# Patient Record
Sex: Female | Born: 1940 | Race: Black or African American | Hispanic: No | Marital: Single | Smoking: Current every day smoker
Health system: Southern US, Community
[De-identification: ages and names within clinical notes are randomized; demographics above are authoritative.]

## PROBLEM LIST (undated history)

## (undated) DIAGNOSIS — J410 Simple chronic bronchitis: Secondary | ICD-10-CM

## (undated) DIAGNOSIS — E785 Hyperlipidemia, unspecified: Secondary | ICD-10-CM

## (undated) DIAGNOSIS — I10 Essential (primary) hypertension: Secondary | ICD-10-CM

## (undated) DIAGNOSIS — Z8679 Personal history of other diseases of the circulatory system: Secondary | ICD-10-CM

## (undated) DIAGNOSIS — Z9289 Personal history of other medical treatment: Secondary | ICD-10-CM

## (undated) DIAGNOSIS — D494 Neoplasm of unspecified behavior of bladder: Secondary | ICD-10-CM

## (undated) DIAGNOSIS — Z87442 Personal history of urinary calculi: Secondary | ICD-10-CM

## (undated) DIAGNOSIS — R011 Cardiac murmur, unspecified: Secondary | ICD-10-CM

## (undated) DIAGNOSIS — R0602 Shortness of breath: Secondary | ICD-10-CM

## (undated) DIAGNOSIS — M069 Rheumatoid arthritis, unspecified: Secondary | ICD-10-CM

## (undated) DIAGNOSIS — E119 Type 2 diabetes mellitus without complications: Secondary | ICD-10-CM

## (undated) DIAGNOSIS — L930 Discoid lupus erythematosus: Secondary | ICD-10-CM

## (undated) DIAGNOSIS — K219 Gastro-esophageal reflux disease without esophagitis: Secondary | ICD-10-CM

## (undated) DIAGNOSIS — E890 Postprocedural hypothyroidism: Secondary | ICD-10-CM

## (undated) DIAGNOSIS — H9190 Unspecified hearing loss, unspecified ear: Secondary | ICD-10-CM

## (undated) DIAGNOSIS — C679 Malignant neoplasm of bladder, unspecified: Secondary | ICD-10-CM

## (undated) DIAGNOSIS — D126 Benign neoplasm of colon, unspecified: Secondary | ICD-10-CM

## (undated) DIAGNOSIS — M810 Age-related osteoporosis without current pathological fracture: Secondary | ICD-10-CM

## (undated) HISTORY — DX: Hyperlipidemia, unspecified: E78.5

## (undated) HISTORY — DX: Age-related osteoporosis without current pathological fracture: M81.0

## (undated) HISTORY — DX: Gastro-esophageal reflux disease without esophagitis: K21.9

## (undated) HISTORY — DX: Postprocedural hypothyroidism: E89.0

## (undated) HISTORY — DX: Personal history of urinary calculi: Z87.442

## (undated) HISTORY — DX: Malignant neoplasm of bladder, unspecified: C67.9

## (undated) HISTORY — DX: Benign neoplasm of colon, unspecified: D12.6

## (undated) HISTORY — DX: Essential (primary) hypertension: I10

---

## 1968-11-10 HISTORY — PX: TOTAL ABDOMINAL HYSTERECTOMY W/ BILATERAL SALPINGOOPHORECTOMY: SHX83

## 1988-03-12 HISTORY — PX: NOSE SURGERY: SHX723

## 1997-10-06 ENCOUNTER — Ambulatory Visit (HOSPITAL_COMMUNITY): Admission: RE | Admit: 1997-10-06 | Discharge: 1997-10-06 | Payer: Self-pay | Admitting: Dermatology

## 1999-04-04 ENCOUNTER — Encounter: Payer: Self-pay | Admitting: Emergency Medicine

## 1999-04-04 ENCOUNTER — Emergency Department (HOSPITAL_COMMUNITY): Admission: EM | Admit: 1999-04-04 | Discharge: 1999-04-05 | Payer: Self-pay | Admitting: Emergency Medicine

## 1999-04-06 ENCOUNTER — Encounter: Admission: RE | Admit: 1999-04-06 | Discharge: 1999-04-06 | Payer: Self-pay | Admitting: Cardiology

## 1999-06-11 DIAGNOSIS — D126 Benign neoplasm of colon, unspecified: Secondary | ICD-10-CM

## 1999-06-11 HISTORY — DX: Benign neoplasm of colon, unspecified: D12.6

## 1999-06-11 HISTORY — PX: OTHER SURGICAL HISTORY: SHX169

## 1999-07-05 ENCOUNTER — Other Ambulatory Visit: Admission: RE | Admit: 1999-07-05 | Discharge: 1999-07-05 | Payer: Self-pay | Admitting: Gastroenterology

## 1999-07-05 ENCOUNTER — Encounter (INDEPENDENT_AMBULATORY_CARE_PROVIDER_SITE_OTHER): Payer: Self-pay | Admitting: Specialist

## 2000-08-17 ENCOUNTER — Emergency Department (HOSPITAL_COMMUNITY): Admission: EM | Admit: 2000-08-17 | Discharge: 2000-08-17 | Payer: Self-pay | Admitting: Emergency Medicine

## 2000-11-06 ENCOUNTER — Ambulatory Visit (HOSPITAL_COMMUNITY): Admission: RE | Admit: 2000-11-06 | Discharge: 2000-11-06 | Payer: Self-pay | Admitting: Endocrinology

## 2000-11-06 ENCOUNTER — Encounter: Payer: Self-pay | Admitting: Endocrinology

## 2001-11-25 ENCOUNTER — Emergency Department (HOSPITAL_COMMUNITY): Admission: EM | Admit: 2001-11-25 | Discharge: 2001-11-25 | Payer: Self-pay | Admitting: Emergency Medicine

## 2001-11-25 ENCOUNTER — Encounter: Payer: Self-pay | Admitting: Emergency Medicine

## 2002-03-13 ENCOUNTER — Encounter: Admission: RE | Admit: 2002-03-13 | Discharge: 2002-03-13 | Payer: Self-pay | Admitting: Endocrinology

## 2002-03-13 ENCOUNTER — Encounter: Payer: Self-pay | Admitting: Endocrinology

## 2003-07-24 ENCOUNTER — Emergency Department (HOSPITAL_COMMUNITY): Admission: EM | Admit: 2003-07-24 | Discharge: 2003-07-24 | Payer: Self-pay | Admitting: Emergency Medicine

## 2003-09-17 ENCOUNTER — Encounter: Admission: RE | Admit: 2003-09-17 | Discharge: 2003-09-17 | Payer: Self-pay | Admitting: Endocrinology

## 2004-04-24 ENCOUNTER — Encounter: Payer: Self-pay | Admitting: Emergency Medicine

## 2004-04-24 ENCOUNTER — Ambulatory Visit: Payer: Self-pay | Admitting: Internal Medicine

## 2004-04-24 ENCOUNTER — Emergency Department (HOSPITAL_COMMUNITY): Admission: EM | Admit: 2004-04-24 | Discharge: 2004-04-25 | Payer: Self-pay | Admitting: Emergency Medicine

## 2004-04-26 ENCOUNTER — Ambulatory Visit: Payer: Self-pay | Admitting: Internal Medicine

## 2004-05-02 ENCOUNTER — Ambulatory Visit: Payer: Self-pay

## 2004-05-15 ENCOUNTER — Ambulatory Visit: Payer: Self-pay | Admitting: Cardiology

## 2004-05-22 ENCOUNTER — Ambulatory Visit: Payer: Self-pay

## 2004-09-07 ENCOUNTER — Ambulatory Visit: Payer: Self-pay | Admitting: Endocrinology

## 2004-09-27 ENCOUNTER — Ambulatory Visit: Payer: Self-pay | Admitting: Cardiology

## 2004-10-16 ENCOUNTER — Ambulatory Visit: Payer: Self-pay | Admitting: Endocrinology

## 2005-03-14 ENCOUNTER — Ambulatory Visit: Payer: Self-pay | Admitting: Cardiology

## 2005-05-14 ENCOUNTER — Ambulatory Visit: Payer: Self-pay | Admitting: Endocrinology

## 2005-05-24 ENCOUNTER — Ambulatory Visit: Payer: Self-pay | Admitting: Endocrinology

## 2005-05-30 ENCOUNTER — Ambulatory Visit: Payer: Self-pay | Admitting: Endocrinology

## 2005-06-19 ENCOUNTER — Ambulatory Visit: Payer: Self-pay | Admitting: Family Medicine

## 2005-06-25 ENCOUNTER — Ambulatory Visit: Payer: Self-pay | Admitting: Gastroenterology

## 2005-07-03 ENCOUNTER — Encounter (INDEPENDENT_AMBULATORY_CARE_PROVIDER_SITE_OTHER): Payer: Self-pay | Admitting: Specialist

## 2005-07-03 ENCOUNTER — Ambulatory Visit: Payer: Self-pay | Admitting: Gastroenterology

## 2005-07-12 ENCOUNTER — Ambulatory Visit: Payer: Self-pay | Admitting: Internal Medicine

## 2006-01-10 ENCOUNTER — Ambulatory Visit: Payer: Self-pay | Admitting: Endocrinology

## 2006-04-16 ENCOUNTER — Ambulatory Visit (HOSPITAL_COMMUNITY): Admission: RE | Admit: 2006-04-16 | Discharge: 2006-04-16 | Payer: Self-pay | Admitting: Endocrinology

## 2006-04-16 ENCOUNTER — Ambulatory Visit: Payer: Self-pay | Admitting: Endocrinology

## 2006-06-26 ENCOUNTER — Ambulatory Visit: Payer: Self-pay | Admitting: Endocrinology

## 2006-06-26 LAB — CONVERTED CEMR LAB
ALT: 20 units/L (ref 0–40)
Alkaline Phosphatase: 61 units/L (ref 39–117)
Bilirubin, Direct: 0.2 mg/dL (ref 0.0–0.3)
Calcium: 9.5 mg/dL (ref 8.4–10.5)
Cholesterol: 122 mg/dL (ref 0–200)
Eosinophils Absolute: 0 10*3/uL (ref 0.0–0.6)
GFR calc Af Amer: 64 mL/min
Hemoglobin: 14 g/dL (ref 12.0–15.0)
MCHC: 33.7 g/dL (ref 30.0–36.0)
MCV: 96.6 fL (ref 78.0–100.0)
Neutro Abs: 3.4 10*3/uL (ref 1.4–7.7)
Platelets: 200 10*3/uL (ref 150–400)
Potassium: 3.6 meq/L (ref 3.5–5.1)
RBC: 4.3 M/uL (ref 3.87–5.11)
RDW: 12.8 % (ref 11.5–14.6)
Sodium: 143 meq/L (ref 135–145)
Total Bilirubin: 0.9 mg/dL (ref 0.3–1.2)
Total CHOL/HDL Ratio: 2.8
Total Protein: 7.6 g/dL (ref 6.0–8.3)
VLDL: 14 mg/dL (ref 0–40)

## 2006-12-30 ENCOUNTER — Encounter: Payer: Self-pay | Admitting: Endocrinology

## 2006-12-30 DIAGNOSIS — I10 Essential (primary) hypertension: Secondary | ICD-10-CM | POA: Insufficient documentation

## 2006-12-30 DIAGNOSIS — Z860100 Personal history of colon polyps, unspecified: Secondary | ICD-10-CM | POA: Insufficient documentation

## 2006-12-30 DIAGNOSIS — M810 Age-related osteoporosis without current pathological fracture: Secondary | ICD-10-CM | POA: Insufficient documentation

## 2006-12-30 DIAGNOSIS — L930 Discoid lupus erythematosus: Secondary | ICD-10-CM

## 2006-12-30 DIAGNOSIS — E785 Hyperlipidemia, unspecified: Secondary | ICD-10-CM

## 2006-12-30 DIAGNOSIS — F17200 Nicotine dependence, unspecified, uncomplicated: Secondary | ICD-10-CM

## 2006-12-30 DIAGNOSIS — M329 Systemic lupus erythematosus, unspecified: Secondary | ICD-10-CM | POA: Insufficient documentation

## 2006-12-30 HISTORY — DX: Essential (primary) hypertension: I10

## 2006-12-30 HISTORY — DX: Age-related osteoporosis without current pathological fracture: M81.0

## 2006-12-30 HISTORY — DX: Hyperlipidemia, unspecified: E78.5

## 2006-12-30 HISTORY — DX: Discoid lupus erythematosus: L93.0

## 2007-01-06 ENCOUNTER — Telehealth (INDEPENDENT_AMBULATORY_CARE_PROVIDER_SITE_OTHER): Payer: Self-pay | Admitting: *Deleted

## 2007-03-07 ENCOUNTER — Emergency Department (HOSPITAL_COMMUNITY): Admission: EM | Admit: 2007-03-07 | Discharge: 2007-03-07 | Payer: Self-pay | Admitting: Emergency Medicine

## 2007-03-10 ENCOUNTER — Ambulatory Visit: Payer: Self-pay | Admitting: Endocrinology

## 2007-03-10 DIAGNOSIS — R05 Cough: Secondary | ICD-10-CM

## 2007-05-08 ENCOUNTER — Ambulatory Visit: Payer: Self-pay | Admitting: Endocrinology

## 2007-05-08 DIAGNOSIS — R109 Unspecified abdominal pain: Secondary | ICD-10-CM | POA: Insufficient documentation

## 2007-05-08 LAB — CONVERTED CEMR LAB
Bilirubin Urine: NEGATIVE
Blood in Urine, dipstick: NEGATIVE
Eosinophils Absolute: 0.1 10*3/uL (ref 0.0–0.6)
Hemoglobin: 13.3 g/dL (ref 12.0–15.0)
Ketones, urine, test strip: NEGATIVE
Lymphocytes Relative: 26.2 % (ref 12.0–46.0)
MCV: 94.3 fL (ref 78.0–100.0)
Monocytes Relative: 8.9 % (ref 3.0–11.0)
Neutro Abs: 3.5 10*3/uL (ref 1.4–7.7)
Neutrophils Relative %: 63.3 % (ref 43.0–77.0)
Platelets: 206 10*3/uL (ref 150–400)
Protein, U semiquant: NEGATIVE
RDW: 13.3 % (ref 11.5–14.6)
WBC Urine, dipstick: NEGATIVE

## 2007-06-03 ENCOUNTER — Encounter: Payer: Self-pay | Admitting: Endocrinology

## 2007-06-06 ENCOUNTER — Telehealth (INDEPENDENT_AMBULATORY_CARE_PROVIDER_SITE_OTHER): Payer: Self-pay | Admitting: *Deleted

## 2007-10-21 ENCOUNTER — Ambulatory Visit: Payer: Self-pay | Admitting: Endocrinology

## 2007-10-21 DIAGNOSIS — M79609 Pain in unspecified limb: Secondary | ICD-10-CM

## 2007-10-21 DIAGNOSIS — L723 Sebaceous cyst: Secondary | ICD-10-CM | POA: Insufficient documentation

## 2007-10-23 ENCOUNTER — Encounter: Payer: Self-pay | Admitting: Endocrinology

## 2007-10-24 ENCOUNTER — Ambulatory Visit: Payer: Self-pay

## 2007-11-21 ENCOUNTER — Ambulatory Visit: Payer: Self-pay | Admitting: Endocrinology

## 2008-01-12 ENCOUNTER — Ambulatory Visit: Payer: Self-pay | Admitting: Endocrinology

## 2008-01-12 LAB — CONVERTED CEMR LAB
Alkaline Phosphatase: 62 units/L (ref 39–117)
BUN: 18 mg/dL (ref 6–23)
Basophils Absolute: 0 10*3/uL (ref 0.0–0.1)
Basophils Relative: 0.4 % (ref 0.0–3.0)
Bilirubin Urine: NEGATIVE
Calcium: 9.3 mg/dL (ref 8.4–10.5)
Creatinine, Ser: 1 mg/dL (ref 0.4–1.2)
Crystals: NEGATIVE
Eosinophils Relative: 1.4 % (ref 0.0–5.0)
GFR calc Af Amer: 71 mL/min
GFR calc non Af Amer: 59 mL/min
Glucose, Bld: 98 mg/dL (ref 70–99)
HDL: 45.5 mg/dL (ref >39.0)
Hemoglobin: 12.9 g/dL (ref 12.0–15.0)
Ketones, ur: NEGATIVE mg/dL
LDL Cholesterol: 74 mg/dL (ref 0–99)
Leukocytes, UA: NEGATIVE
MCHC: 35.4 g/dL (ref 30.0–36.0)
Monocytes Absolute: 0.4 10*3/uL (ref 0.1–1.0)
Neutrophils Relative %: 59.7 % (ref 43.0–77.0)
Platelets: 207 10*3/uL (ref 150–400)
Potassium: 3.7 meq/L (ref 3.5–5.1)
RBC: 3.9 M/uL (ref 3.87–5.11)
RDW: 14.1 % (ref 11.5–14.6)
Sodium: 140 meq/L (ref 135–145)
Specific Gravity, Urine: 1.015 (ref 1.000–1.03)
TSH: 8.72 microintl units/mL — ABNORMAL HIGH (ref 0.35–5.50)
Total Bilirubin: 0.6 mg/dL (ref 0.3–1.2)
Urobilinogen, UA: 0.2 (ref 0.0–1.0)
VLDL: 13 mg/dL (ref 0–40)

## 2008-01-13 ENCOUNTER — Ambulatory Visit: Payer: Self-pay | Admitting: Internal Medicine

## 2008-01-13 ENCOUNTER — Ambulatory Visit: Payer: Self-pay | Admitting: Endocrinology

## 2008-03-11 ENCOUNTER — Emergency Department (HOSPITAL_COMMUNITY): Admission: EM | Admit: 2008-03-11 | Discharge: 2008-03-12 | Payer: Self-pay | Admitting: Emergency Medicine

## 2008-03-22 ENCOUNTER — Ambulatory Visit: Payer: Self-pay | Admitting: Endocrinology

## 2008-03-22 DIAGNOSIS — M25559 Pain in unspecified hip: Secondary | ICD-10-CM | POA: Insufficient documentation

## 2008-03-31 ENCOUNTER — Encounter: Payer: Self-pay | Admitting: Endocrinology

## 2008-04-06 ENCOUNTER — Ambulatory Visit: Payer: Self-pay | Admitting: Endocrinology

## 2008-04-06 DIAGNOSIS — E890 Postprocedural hypothyroidism: Secondary | ICD-10-CM

## 2008-04-06 HISTORY — DX: Postprocedural hypothyroidism: E89.0

## 2008-04-08 LAB — CONVERTED CEMR LAB
AST: 25 units/L (ref 0–37)
Albumin: 3.5 g/dL (ref 3.5–5.2)
Alkaline Phosphatase: 58 units/L (ref 39–117)
BUN: 18 mg/dL (ref 6–23)
Basophils Relative: 0 % (ref 0.0–3.0)
Bilirubin, Direct: 0.1 mg/dL (ref 0.0–0.3)
CO2: 29 meq/L (ref 19–32)
Calcium: 9.6 mg/dL (ref 8.4–10.5)
Creatinine, Ser: 1 mg/dL (ref 0.4–1.2)
Eosinophils Absolute: 0.1 10*3/uL (ref 0.0–0.7)
GFR calc Af Amer: 71 mL/min
GFR calc non Af Amer: 59 mL/min
Glucose, Bld: 89 mg/dL (ref 70–99)
HCT: 40.6 % (ref 36.0–46.0)
Hemoglobin: 13.7 g/dL (ref 12.0–15.0)
MCV: 96.3 fL (ref 78.0–100.0)
Neutro Abs: 3.5 10*3/uL (ref 1.4–7.7)
Platelets: 258 10*3/uL (ref 150–400)
RBC: 4.21 M/uL (ref 3.87–5.11)
RDW: 13.8 % (ref 11.5–14.6)
Sodium: 140 meq/L (ref 135–145)
Total Bilirubin: 0.5 mg/dL (ref 0.3–1.2)
Total Protein: 7.4 g/dL (ref 6.0–8.3)
WBC: 5.7 10*3/uL (ref 4.5–10.5)

## 2008-04-13 ENCOUNTER — Ambulatory Visit: Payer: Self-pay | Admitting: Cardiology

## 2008-04-28 ENCOUNTER — Ambulatory Visit: Payer: Self-pay | Admitting: Endocrinology

## 2008-04-28 DIAGNOSIS — R7302 Impaired glucose tolerance (oral): Secondary | ICD-10-CM

## 2008-04-28 LAB — CONVERTED CEMR LAB: TSH: 0.61 microintl units/mL (ref 0.35–5.50)

## 2008-05-06 ENCOUNTER — Encounter: Payer: Self-pay | Admitting: Endocrinology

## 2008-05-06 ENCOUNTER — Ambulatory Visit (HOSPITAL_COMMUNITY): Admission: RE | Admit: 2008-05-06 | Discharge: 2008-05-06 | Payer: Self-pay | Admitting: Endocrinology

## 2008-07-02 ENCOUNTER — Encounter: Payer: Self-pay | Admitting: Endocrinology

## 2008-11-11 ENCOUNTER — Ambulatory Visit: Payer: Self-pay | Admitting: Endocrinology

## 2008-11-11 DIAGNOSIS — M545 Low back pain: Secondary | ICD-10-CM

## 2008-11-15 LAB — CONVERTED CEMR LAB
CO2: 29 meq/L (ref 19–32)
Chloride: 102 meq/L (ref 96–112)
GFR calc non Af Amer: 79.96 mL/min (ref >60)
Glucose, Bld: 91 mg/dL (ref 70–99)
Sed Rate: 55 mm/hr — ABNORMAL HIGH (ref 0–22)

## 2008-12-22 ENCOUNTER — Ambulatory Visit: Payer: Self-pay | Admitting: Internal Medicine

## 2009-02-21 ENCOUNTER — Ambulatory Visit: Payer: Self-pay | Admitting: Endocrinology

## 2009-02-21 DIAGNOSIS — M2550 Pain in unspecified joint: Secondary | ICD-10-CM | POA: Insufficient documentation

## 2009-02-21 DIAGNOSIS — R634 Abnormal weight loss: Secondary | ICD-10-CM | POA: Insufficient documentation

## 2009-02-21 LAB — CONVERTED CEMR LAB
ALT: 29 units/L (ref 0–35)
AST: 32 units/L (ref 0–37)
Albumin: 3.6 g/dL (ref 3.5–5.2)
Alkaline Phosphatase: 68 units/L (ref 39–117)
Basophils Absolute: 0 10*3/uL (ref 0.0–0.1)
Basophils Relative: 0.5 % (ref 0.0–3.0)
Creatinine, Ser: 0.9 mg/dL (ref 0.4–1.2)
Eosinophils Absolute: 0 10*3/uL (ref 0.0–0.7)
Eosinophils Relative: 0.5 % (ref 0.0–5.0)
GFR calc non Af Amer: 79.89 mL/min (ref >60)
Glucose, Bld: 94 mg/dL (ref 70–99)
HCT: 41.6 % (ref 36.0–46.0)
HDL: 50.9 mg/dL (ref >39.00)
Hemoglobin, Urine: NEGATIVE
Hemoglobin: 14.5 g/dL (ref 12.0–15.0)
Ketones, ur: NEGATIVE mg/dL
Leukocytes, UA: NEGATIVE
Lymphocytes Relative: 19 % (ref 12.0–46.0)
Lymphs Abs: 1 10*3/uL (ref 0.7–4.0)
MCHC: 34.9 g/dL (ref 30.0–36.0)
Microalb, Ur: 0.1 mg/dL (ref 0.0–1.9)
Monocytes Relative: 9.1 % (ref 3.0–12.0)
Neutrophils Relative %: 70.9 % (ref 43.0–77.0)
Nitrite: NEGATIVE
Platelets: 204 10*3/uL (ref 150.0–400.0)
Potassium: 4.1 meq/L (ref 3.5–5.1)
RBC: 4.29 M/uL (ref 3.87–5.11)
RDW: 12.9 % (ref 11.5–14.6)
Sed Rate: 61 mm/hr — ABNORMAL HIGH (ref 0–22)
VLDL: 11.6 mg/dL (ref 0.0–40.0)
WBC: 5.1 10*3/uL (ref 4.5–10.5)
pH: 7.5 (ref 5.0–8.0)

## 2009-03-30 ENCOUNTER — Ambulatory Visit: Payer: Self-pay | Admitting: Internal Medicine

## 2009-03-30 DIAGNOSIS — K645 Perianal venous thrombosis: Secondary | ICD-10-CM

## 2009-03-30 DIAGNOSIS — K5900 Constipation, unspecified: Secondary | ICD-10-CM | POA: Insufficient documentation

## 2009-03-30 DIAGNOSIS — K921 Melena: Secondary | ICD-10-CM

## 2009-04-06 ENCOUNTER — Encounter (INDEPENDENT_AMBULATORY_CARE_PROVIDER_SITE_OTHER): Payer: Self-pay | Admitting: *Deleted

## 2009-04-11 ENCOUNTER — Encounter: Payer: Self-pay | Admitting: Endocrinology

## 2009-05-09 ENCOUNTER — Encounter (INDEPENDENT_AMBULATORY_CARE_PROVIDER_SITE_OTHER): Payer: Self-pay | Admitting: *Deleted

## 2009-05-11 ENCOUNTER — Ambulatory Visit: Payer: Self-pay | Admitting: Gastroenterology

## 2009-05-13 ENCOUNTER — Encounter: Payer: Self-pay | Admitting: Endocrinology

## 2009-05-25 ENCOUNTER — Ambulatory Visit: Payer: Self-pay | Admitting: Gastroenterology

## 2009-05-26 ENCOUNTER — Encounter: Payer: Self-pay | Admitting: Gastroenterology

## 2009-10-19 ENCOUNTER — Ambulatory Visit: Payer: Self-pay | Admitting: Endocrinology

## 2009-10-19 ENCOUNTER — Encounter: Payer: Self-pay | Admitting: Endocrinology

## 2009-10-19 DIAGNOSIS — J45909 Unspecified asthma, uncomplicated: Secondary | ICD-10-CM | POA: Insufficient documentation

## 2009-10-19 DIAGNOSIS — R0602 Shortness of breath: Secondary | ICD-10-CM | POA: Insufficient documentation

## 2009-10-20 LAB — CONVERTED CEMR LAB
AST: 30 units/L (ref 0–37)
Albumin: 3.5 g/dL (ref 3.5–5.2)
Bilirubin, Direct: 0.1 mg/dL (ref 0.0–0.3)
Calcium, Total (PTH): 9.5 mg/dL (ref 8.4–10.5)
Chloride: 105 meq/L (ref 96–112)
Creatinine,U: 114.8 mg/dL
Eosinophils Absolute: 0 10*3/uL (ref 0.0–0.7)
Eosinophils Relative: 1 % (ref 0.0–5.0)
GFR calc non Af Amer: 85.17 mL/min (ref >60)
HCT: 37 % (ref 36.0–46.0)
Hemoglobin: 12.7 g/dL (ref 12.0–15.0)
LDL Cholesterol: 65 mg/dL (ref 0–99)
Lymphocytes Relative: 17.5 % (ref 12.0–46.0)
Lymphs Abs: 0.8 10*3/uL (ref 0.7–4.0)
Microalb, Ur: 1.3 mg/dL (ref 0.0–1.9)
Monocytes Relative: 9.7 % (ref 3.0–12.0)
Neutro Abs: 3.3 10*3/uL (ref 1.4–7.7)
Neutrophils Relative %: 71.4 % (ref 43.0–77.0)
PTH: 34.4 pg/mL (ref 14.0–72.0)
RBC: 3.84 M/uL — ABNORMAL LOW (ref 3.87–5.11)
RDW: 14.4 % (ref 11.5–14.6)
Sodium: 141 meq/L (ref 135–145)
Specific Gravity, Urine: 1.015 (ref 1.000–1.030)
TSH: 0.37 microintl units/mL (ref 0.35–5.50)
Total Bilirubin: 0.6 mg/dL (ref 0.3–1.2)
Total CHOL/HDL Ratio: 3
Triglycerides: 69 mg/dL (ref 0.0–149.0)
pH: 7 (ref 5.0–8.0)

## 2009-10-28 ENCOUNTER — Telehealth: Payer: Self-pay | Admitting: Endocrinology

## 2010-03-16 ENCOUNTER — Ambulatory Visit
Admission: RE | Admit: 2010-03-16 | Discharge: 2010-03-16 | Payer: Self-pay | Source: Home / Self Care | Attending: Internal Medicine | Admitting: Internal Medicine

## 2010-03-16 DIAGNOSIS — R062 Wheezing: Secondary | ICD-10-CM | POA: Insufficient documentation

## 2010-03-17 ENCOUNTER — Telehealth: Payer: Self-pay | Admitting: Internal Medicine

## 2010-04-13 NOTE — Letter (Signed)
Summary: [facility] Instructions  [facility]  [address] [place], [place] [address]   Phone: [phone]  Fax: [phone]       Jennifer Ball    1940-12-27    MRN: [id number]        Procedure Day Dorna Bloom: Wednesday 05/25/2009     Arrival Time: 8:00 am      Procedure Time: 9:00 am     Location of Procedure:                    _x _  [facility] (4th Floor)                        PREPARATION FOR COLONOSCOPY WITH MOVIPREP   Starting 5 days prior to your procedure Friday 3/11  do not eat nuts, seeds, popcorn, corn, beans, peas,  salads, or any raw vegetables.  Do not take any fiber supplements (e.g. Metamucil, Citrucel, and Benefiber).  THE DAY BEFORE YOUR PROCEDURE         DATE: Tuesday 3/15  1.  Drink clear liquids the entire day-NO SOLID FOOD  2.  Do not drink anything colored red or purple.  Avoid juices with pulp.  No orange juice.  3.  Drink at least 64 oz. (8 glasses) of fluid/clear liquids during the day to prevent dehydration and help the prep work efficiently.  CLEAR LIQUIDS INCLUDE: Water Jello Ice Popsicles Tea (sugar ok, no milk/cream) Powdered fruit flavored drinks Coffee (sugar ok, no milk/cream) Gatorade Juice: apple, white grape, white cranberry  Lemonade Clear bullion, consomm, broth Carbonated beverages (any kind) Strained chicken noodle soup Hard Candy                             4.  In the morning, mix first dose of MoviPrep solution:    Empty 1 Pouch A and 1 Pouch B into the disposable container    Add lukewarm drinking water to the top line of the container. Mix to dissolve    Refrigerate (mixed solution should be used within 24 hrs)  5.  Begin drinking the prep at 5:00 p.m. The MoviPrep container is divided by 4 marks.   Every 15 minutes drink the solution down to the next mark (approximately 8 oz) until the full liter is complete.   6.  Follow completed prep with 16 oz of clear liquid of your choice (Nothing  red or purple).  Continue to drink clear liquids until bedtime.  7.  Before going to bed, mix second dose of MoviPrep solution:    Empty 1 Pouch A and 1 Pouch B into the disposable container    Add lukewarm drinking water to the top line of the container. Mix to dissolve    Refrigerate  THE DAY OF YOUR PROCEDURE      DATE: Wednesday 3/16  Beginning at 4:00 a.m. (5 hours before procedure):         1. Every 15 minutes, drink the solution down to the next mark (approx 8 oz) until the full liter is complete.  2. Follow completed prep with 16 oz. of clear liquid of your choice.    3. You may drink clear liquids until 7:00 am  (2 HOURS BEFORE PROCEDURE).   MEDICATION INSTRUCTIONS  Unless otherwise instructed, you should take regular prescription medications with a small sip of water   as early as possible the morning  of your procedure.   Additional medication instructions: Hold Triameterne/HCTZ morning of procedure.         OTHER INSTRUCTIONS  You will need a responsible adult at least 70 years of age to accompany you and drive you home.   This person must remain in the waiting room during your procedure.  Wear loose fitting clothing that is easily removed.  Leave jewelry and other valuables at home.  However, you may wish to bring a book to read or  an iPod/MP3 player to listen to music as you wait for your procedure to start.  Remove all body piercing jewelry and leave at home.  Total time from sign-in until discharge is approximately 2-3 hours.  You should go home directly after your procedure and rest.  You can resume normal activities the  day after your procedure.  The day of your procedure you should not:   Drive   Make legal decisions   Operate machinery   Drink alcohol   Return to work  You will receive specific instructions about eating, activities and medications before you leave.    The above instructions have been reviewed and explained to me  by   Ezra Sites RN  May 11, 2009 8:08 AM     I fully understand and can verbalize these instructions _____________________________ Date _________

## 2010-04-13 NOTE — Letter (Signed)
Summary: Previsit letter  [facility]  [address] [place], [place] [address]   Phone: [phone]  Fax: [phone]       04/06/2009 MRN: [id number]  Jennifer Ball [address] [place], [place]  [address]  Dear [name],  Welcome to the Gastroenterology Division at [facility].    You are scheduled to see a nurse for your pre-procedure visit on 05-11-09 at 8:00a.m. on the 3rd floor at [facility], [address]  We ask that you try to arrive at our office 15 minutes prior to your appointment time to allow for check-in.  Your nurse visit will consist of discussing your medical and surgical history, your immediate family medical history, and your medications.    Please bring a complete list of all your medications or, if you prefer, bring the medication bottles and we will list them.  We will need to be aware of both prescribed and over the counter drugs.  We will need to know exact dosage information as well.  If you are on blood thinners (Coumadin, Plavix, Aggrenox, Ticlid, etc.) please call our office today/prior to your appointment, as we need to consult with your physician about holding your medication.   Please be prepared to read and sign documents such as consent forms, a financial agreement, and acknowledgement forms.  If necessary, and with your consent, a friend or relative is welcome to sit-in on the nurse visit with you.  Please bring your insurance card so that we may make a copy of it.  If your insurance requires a referral to see a specialist, please bring your referral form from your primary care physician.  No co-pay is required for this nurse visit.     If you cannot keep your appointment, please call [phone] to cancel or reschedule prior to your appointment date.  This allows [place] the opportunity to schedule an appointment for another patient in need of care.    Thank you for choosing [facility] for your medical needs.  We  appreciate the opportunity to care for you.  Please visit [place] at our website  to learn more about our practice.                     Sincerely.                                                                                                                   The Gastroenterology Division

## 2010-04-13 NOTE — Progress Notes (Signed)
Summary: Rx refill request  Phone Note Refill Request Call back at Home Phone [phone] Message from:  Patient on October 28, 2009 2:48 PM  Refills Requested: Medication #1:  CRESTOR 40 MG TABS take 1 by mouth once daily   Last Refilled: 09/19/2009  Medication #2:  MAXZIDE 75-50 MG TABS 1/2 tab by mouth once daily   Last Refilled: 09/29/2009  Method Requested: Electronic Initial call taken by: Brenton Grills MA,  October 28, 2009 2:49 PM    Prescriptions: MAXZIDE 75-50 MG TABS (TRIAMTERENE-HCTZ) 1/2 tab by mouth once daily  #15 Tablet x 6   Entered by:   Brenton Grills MA   Authorized by:   Minus Breeding MD   Signed by:   Brenton Grills MA on 10/28/2009   Method used:   Electronically to        [facility]  [address] [id number]* (retail)       [address]       [place], [place]  [address]       Ph: [phone] or [phone]       Fax: [phone]   RxID:   [id number] CRESTOR 40 MG TABS (ROSUVASTATIN CALCIUM) take 1 by mouth once daily  #30 Tablet x 6   Entered by:   Brenton Grills MA   Authorized by:   Minus Breeding MD   Signed by:   Brenton Grills MA on 10/28/2009   Method used:   Electronically to        [facility]  [address] [id number]* (retail)       [address]       [place], [place]  [address]       Ph: [phone] or [phone]       Fax: [phone]   RxID:   [id number]

## 2010-04-13 NOTE — Assessment & Plan Note (Signed)
Summary: COLD SXS - DBD   Vital Signs:  Patient profile:   70 year old female Height:      66 inches (167.64 cm) Weight:      190 pounds (86.36 kg) O2 Sat:      91 % on Room air Temp:     99.4 degrees F (37.44 degrees C) oral Pulse rate:   98 / minute BP sitting:   110 / 74  (left arm) Cuff size:   large  Vitals Entered By: Orlan Leavens RMA (March 16, 2010 2:22 PM)  O2 Flow:  Room air  CC: Cold sxs Is Patient Diabetic? Yes Did you bring your meter with you today? No Pain Assessment Patient in pain? no        Primary Care Provider:  Minus Breeding MD  CC:  Cold sxs.  History of Present Illness: here with acute onset 3 days gradually worsening fever, ST, and now prod cough with greenish sputum and mild wheezing/sob., also with general weakness and malaise.  Pt denies CP,  orthopnea, pnd, worsening LE edema, palps, dizziness or syncope  .  Pt denies new neuro symptoms such as headache, facial or extremity weakness Pt denies polydipsia, polyuria, or low sugar symptoms such as shakiness improved with eating.  Overall good compliance with meds, trying to follow low chol, DM diet, wt stable, little excercise however  CBG's in the lower 100's.  Overall good compliance with meds, and good tolerability.  Problems Prior to Update: 1)  Need Prophylactic Vaccination&inoculation Flu  (ICD-V04.81) 2)  Wheezing  (ICD-786.07) 3)  Bronchitis-acute  (ICD-466.0) 4)  Asthma  (ICD-493.90) 5)  Dyspnea  (ICD-786.05) 6)  Constipation  (ICD-564.00) 7)  Hematochezia  (ICD-578.1) 8)  External Hemorrhoids, Thrombosed  (ICD-455.4) 9)  Weight Loss  (ICD-783.21) 10)  Encounter For Long-term Use of Other Medications  (ICD-V58.69) 11)  Arthralgia  (ICD-719.40) 12)  Contact/exposure To Other Communicable Diseases  (ICD-V01.89) 13)  Back Pain, Lumbar  (ICD-724.2) 14)  Dm  (ICD-250.00) 15)  Hypothyroidism, Post-radiation  (ICD-244.1) 16)  Hip Pain, Right  (ICD-719.45) 17)  Routine General Medical  Exam@health  Care Facl  (ICD-V70.0) 18)  Sebaceous Cyst, Neck  (ICD-706.2) 19)  Leg Pain, Bilateral  (ICD-729.5) 20)  Abdominal Pain  (ICD-789.00) 21)  Cough  (ICD-786.2) 22)  Smoker  (ICD-305.1) 23)  Lupus  (ICD-710.0) 24)  Hyperlipidemia  (ICD-272.4) 25)  Osteoporosis  (ICD-733.00) 26)  Hypertension  (ICD-401.9) 27)  Colonic Polyps, Hx of  (ICD-V12.72)  Medications Prior to Update: 1)  Crestor 40 Mg Tabs (Rosuvastatin Calcium) .... Take 1 By Mouth Once Daily 2)  Klor-Con M20 20 Meq Tbcr (Potassium Chloride Crys Cr) .... Take 1 Tablet By Mouth Once A Day 3)  Maxzide 75-50 Mg Tabs (Triamterene-Hctz) .... 1/2 Tab By Mouth Once Daily 4)  Screening Mammography 5)  Levothyroxine Sodium 75 Mcg Tabs (Levothyroxine Sodium) .... Qd 6)  Aspirin 325 Mg Tabs (Aspirin) .Marland Kitchen.. 1 Once Daily 7)  Hydrocodone-Acetaminophen 10-325 Mg Tabs (Hydrocodone-Acetaminophen) .... 1/2-1, Every 4 Hrs As Needed For Pain  Current Medications (verified): 1)  Crestor 40 Mg Tabs (Rosuvastatin Calcium) .... Take 1 By Mouth Once Daily 2)  Klor-Con M20 20 Meq Tbcr (Potassium Chloride Crys Cr) .... Take 1 Tablet By Mouth Once A Day 3)  Levothyroxine Sodium 75 Mcg Tabs (Levothyroxine Sodium) .... Qd 4)  Aspirin 325 Mg Tabs (Aspirin) .Marland Kitchen.. 1 Once Daily 5)  Maxzide 75-50 Mg Tabs (Triamterene-Hctz) .Marland Kitchen.. 1 By Mouth Once Daily 6)  Cephalexin 500 Mg Caps (Cephalexin) .Marland Kitchen.. 1po Three Times A Day 7)  Tessalon Perles 100 Mg Caps (Benzonatate) .Marland Kitchen.. 1-2 By Mouth Three Times A Day As Needed Cough 8)  Prednisone 10 Mg Tabs (Prednisone) .... 3po Qd For 3days, Then 2po Qd For 3days, Then 1po Qd For 3days, Then Stop  Allergies (verified): 1)  Lisinopril (Lisinopril)  Past History:  Past Medical History: Last updated: 05/08/2007 Colonic polyps, hx of Hypertension Osteoporosis Hyperlipidemia Hypothyroidism tah/bso  Past Surgical History: Last updated: 04/06/2008 Hysterectomy and bso 1970's I-131 Therapy (06/1999)  Social  History: Last updated: 12/22/2008 married retired Current Smoker Alcohol use-no Drug use-no Regular exercise-no  Risk Factors: Exercise: no (12/22/2008)  Risk Factors: Smoking Status: current (12/22/2008) Packs/Day: 1.5 (12/22/2008)  Review of Systems       all otherwise negative per pt -    Physical Exam  General:  alert and overweight-appearing.  , mild ill  Head:  normocephalic and atraumatic.   Eyes:  vision grossly intact, pupils equal, and pupils round.   Ears:  bilat tm's mild red, sinus nontender, canals clear Nose:  nasal dischargemucosal pallor and mucosal edema.   Mouth:  pharyngeal erythema and fair dentition.   Neck:  supple and no JVD.   Lungs:  normal respiratory effort, R decreased breath sounds, R wheezes, L decreased breath sounds, and L wheezes.   Heart:  normal rate and regular rhythm.   Extremities:  no edema, no erythema    Impression & Recommendations:  Problem # 1:  BRONCHITIS-ACUTE (ICD-466.0)  Her updated medication list for this problem includes:    Cephalexin 500 Mg Caps (Cephalexin) .Marland Kitchen... 1po three times a day    Tessalon Perles 100 Mg Caps (Benzonatate) .Marland Kitchen... 1-2 by mouth three times a day as needed cough treat as above, f/u any worsening signs or symptoms , declines cxr today  Problem # 2:  WHEEZING (ICD-786.07) mild, likely due to above, for predpack for home, and tess perles for cough as needed   Problem # 3:  HYPERTENSION (ICD-401.9)  The following medications were removed from the medication list:    Maxzide 75-50 Mg Tabs (Triamterene-hctz) .Marland Kitchen... 1/2 tab by mouth once daily Her updated medication list for this problem includes:    Maxzide 75-50 Mg Tabs (Triamterene-hctz) .Marland Kitchen... 1 by mouth once daily  BP today: 110/74 Prior BP: 108/72 (10/19/2009)  Labs Reviewed: K+: 3.8 (10/19/2009) Creat: : 0.9 (10/19/2009)   Chol: 122 (10/19/2009)   HDL: 43.50 (10/19/2009)   LDL: 65 (10/19/2009)   TG: 69.0 (10/19/2009) stable overall by hx  and exam, ok to continue meds/tx as is   Problem # 4:  DM (ICD-250.00)  Her updated medication list for this problem includes:    Aspirin 325 Mg Tabs (Aspirin) .Marland Kitchen... 1 once daily  Labs Reviewed: Creat: 0.9 (10/19/2009)    Reviewed HgBA1c results: 6.1 (10/19/2009)  6.2 (02/21/2009) stable overall by hx and exam, ok to continue meds/tx as is , Pt to cont DM diet, excercise, wt control efforts; to check labs next visit  Complete Medication List: 1)  Crestor 40 Mg Tabs (Rosuvastatin calcium) .... Take 1 by mouth once daily 2)  Klor-con M20 20 Meq Tbcr (Potassium chloride crys cr) .... Take 1 tablet by mouth once a day 3)  Levothyroxine Sodium 75 Mcg Tabs (Levothyroxine sodium) .... Qd 4)  Aspirin 325 Mg Tabs (Aspirin) .Marland Kitchen.. 1 once daily 5)  Maxzide 75-50 Mg Tabs (Triamterene-hctz) .Marland Kitchen.. 1 by mouth once daily 6)  Cephalexin 500 Mg  Caps (Cephalexin) .Marland Kitchen.. 1po three times a day 7)  Tessalon Perles 100 Mg Caps (Benzonatate) .Marland Kitchen.. 1-2 by mouth three times a day as needed cough 8)  Prednisone 10 Mg Tabs (Prednisone) .... 3po qd for 3days, then 2po qd for 3days, then 1po qd for 3days, then stop  Other Orders: Administration Flu vaccine - MCR (G0008) Flu Vaccine 36yrs + MEDICARE PATIENTS (F7510)  Patient Instructions: 1)  Please take all new medications as prescribed - the antibiotic, pill for cough (which does not have narcotic), and prednisone 2)  Continue all previous medications as before this visit  3)  Please schedule an appointment with your primary doctor as needed Prescriptions: PREDNISONE 10 MG TABS (PREDNISONE) 3po qd for 3days, then 2po qd for 3days, then 1po qd for 3days, then stop  #18 x 0   Entered and Authorized by:   Corwin Levins MD   Signed by:   Corwin Levins MD on 03/16/2010   Method used:   Print then Give to Patient   RxID:   [id number] TESSALON PERLES 100 MG CAPS (BENZONATATE) 1-2 by mouth three times a day as needed cough  #60 x 1   Entered and Authorized by:   Corwin Levins MD   Signed by:   Corwin Levins MD on 03/16/2010   Method used:   Print then Give to Patient   RxID:   [id number] CEPHALEXIN 500 MG CAPS (CEPHALEXIN) 1po three times a day  #30 x 0   Entered and Authorized by:   Corwin Levins MD   Signed by:   Corwin Levins MD on 03/16/2010   Method used:   Print then Give to Patient   RxID:   [id number]    Orders Added: 1)  Administration Flu vaccine - MCR [G0008] 2)  Flu Vaccine 67yrs + MEDICARE PATIENTS [Q2039] 3)  Est. Patient Level IV [82505]   Immunizations Administered:  Influenza Vaccine # 1:    Vaccine Type: Fluvax MCR    Site: left deltoid    Mfr: Sanofi Pasteur    Dose: 0.5 ml    Route: IM    Given by: Orlan Leavens RMA    Exp. Date: 09/09/2010    Lot #: [id number]    VIS given: 03/16/10   Immunizations Administered:  Influenza Vaccine # 1:    Vaccine Type: Fluvax MCR    Site: left deltoid    Mfr: Sanofi Pasteur    Dose: 0.5 ml    Route: IM    Given by: Orlan Leavens RMA    Exp. Date: 09/09/2010    Lot #: [id number]    VIS given: 03/16/10

## 2010-04-13 NOTE — Letter (Signed)
Summary: Aundra Dubin MD  Aundra Dubin MD   Imported By: Sherian Rein 04/18/2009 10:38:20  _____________________________________________________________________  External Attachment:    Type:   Image     Comment:   External Document

## 2010-04-13 NOTE — Procedures (Signed)
Summary: Colonoscopy  Patient: [name] Note: All result statuses are Final unless otherwise noted.  Tests: (1) Colonoscopy (COL)   COL Colonoscopy           DONE     [facility]     [address]     [place], [place]  [address]           COLONOSCOPY PROCEDURE REPORT           PATIENT:  Jennifer Ball, Jennifer Ball  MR#:  [id number]     BIRTHDATE:  [birth year], 69 yrs. old  GENDER:  female           ENDOSCOPIST:  Judie Petit T. Russella Dar, MD, [facility]           PROCEDURE DATE:  05/25/2009     PROCEDURE:  Colonoscopy with snare polypectomy     ASA CLASS:  Class II     INDICATIONS:  1) hematochezia  2) follow-up of polyp, adenomatous     polyp, 06/1999.           MEDICATIONS:   Fentanyl 125 mcg IV, Versed 12 mg IV           DESCRIPTION OF PROCEDURE:   After the risks benefits and     alternatives of the procedure were thoroughly explained, informed     consent was obtained.  Digital rectal exam was performed and     revealed no abnormalities.   The LB [id number] endoscope     was introduced through the anus and advanced to the cecum, which     was identified by both the appendix and ileocecal valve, without     limitations.  The quality of the prep was excellent, using     MoviPrep.  The instrument was then slowly withdrawn as the colon     was fully examined.     <<PROCEDUREIMAGES>>           FINDINGS:  A sessile polyp was found at the hepatic flexure. It     was 5 mm in size. Polyp was snared without cautery. Retrieval was     successful. A sessile polyp was found in the descending colon. It     was 5 mm in size. Polyp was snared without cautery. Retrieval was     unsuccessful. This was otherwise a normal examination of the     colon. Retroflexed views in the rectum revealed internal     hemorrhoids, small. The time to cecum =  7.33  minutes. The scope     was then withdrawn (time =  10  min) from the patient and the     procedure completed.           COMPLICATIONS:  None       ENDOSCOPIC IMPRESSION:     1) 5 mm sessile polyp at the hepatic flexure     2) 5 mm sessile polyp in the descending colon     3) Internal hemorrhoids           RECOMMENDATIONS:     1) Await pathology results     2) Repeat Colonoscopy in 5 years.           [name]. Russella Dar, MD, [facility]           CC: Corwin Levins, MD           n.     Rosalie DoctorVenita Lick. Randell Detter at 05/25/2009 09:48 AM  Herman, Mell, [phone]  Note: An exclamation mark (!) indicates a result that was not dispersed into the flowsheet. Document Creation Date: 05/25/2009 9:48 AM _______________________________________________________________________  (1) Order result status: Final Collection or observation date-time: 05/25/2009 09:42 Requested date-time:  Receipt date-time:  Reported date-time:  Referring Physician:   Ordering Physician: Claudette Head [id number]) Specimen Source:  Source: Launa Grill Order Number: [id number] Lab site:   Appended Document: Colonoscopy     Procedures Next Due Date:    Colonoscopy: 05/2014

## 2010-04-13 NOTE — Progress Notes (Signed)
Summary: ALT med  Phone Note Call from Patient Call back at Home Phone [phone]   Caller: Patient Summary of Call: Pt called stating Tessalon perles are not covered under he Insurance. Pt is requesting alternate med for cough Initial call taken by: Margaret Pyle, CMA,  March 17, 2010 8:34 AM  Follow-up for Phone Call        they are $4 for 60 pills cash - consider going to [facility] or [facility] or [facility]  other meds are likely more expensive Follow-up by: Corwin Levins MD,  March 17, 2010 12:58 PM  Additional Follow-up for Phone Call Additional follow up Details #1::        Pt advised and states she was wrong about price, med was $15 so she has already started Additional Follow-up by: Margaret Pyle, CMA,  March 17, 2010 1:32 PM

## 2010-04-13 NOTE — Assessment & Plan Note (Signed)
Summary: YEARLY FU/ MEDICARE / NWS   Vital Signs:  Patient profile:   70 year old female Height:      66 inches (167.64 cm) Weight:      190.38 pounds (86.54 kg) BMI:     30.84 O2 Sat:      95 % on Room air Temp:     98.0 degrees F (36.67 degrees C) oral Pulse rate:   80 / minute BP sitting:   108 / 72  (left arm) Cuff size:   regular  Vitals Entered By: Brenton Grills MA (October 19, 2009 8:09 AM)  O2 Flow:  Room air CC: Yearly F/U Medicare//pt no longer takes Aspirin 81mg , Magnesium, Vicodin or using Lidocaine cream/aj Comments Pt is due for a mammogram and no longer gets yearly paps   Primary Provider:  Minus Breeding MD  CC:  Yearly F/U Medicare//pt no longer takes Aspirin 81mg , Magnesium, and Vicodin or using Lidocaine cream/aj.  History of Present Illness: here for regular wellness examination.  she's feeling pretty well in general, except as noted below, and does not drink etoh.   Current Medications (verified): 1)  Crestor 40 Mg Tabs (Rosuvastatin Calcium) .... Take 1 By Mouth Once Daily 2)  Klor-Con M20 20 Meq Tbcr (Potassium Chloride Crys Cr) .... Take 1 Tablet By Mouth Once A Day 3)  Maxzide 75-50 Mg Tabs (Triamterene-Hctz) .... 1/2 Tab By Mouth Once Daily 4)  Screening Mammography 5)  Levothyroxine Sodium 75 Mcg Tabs (Levothyroxine Sodium) .... Qd 6)  Vicodin 5-500 Mg Tabs (Hydrocodone-Acetaminophen) .Marland Kitchen.. 1 Q4h As Needed Pain 7)  Aspirin 81 Mg Tbec (Aspirin) .Marland Kitchen.. 1 Tab Daily 8)  Magnesium Oxide 400 Mg Caps (Magnesium Oxide) .Marland Kitchen.. 1po Once Daily 9)  Lidocaine-Hydrocortisone Ace 3-0.5 % Crea (Lidocaine-Hydrocortisone Ace) .... Use Asd Two Times A Day As Needed 10)  Aspirin 325 Mg Tabs (Aspirin) .Marland Kitchen.. 1 Once Daily  Allergies (verified): 1)  Lisinopril (Lisinopril)  Family History: Reviewed history from 01/12/2008 and no changes required. mother had pancreatic cancer  Social History: Reviewed history from 12/22/2008 and no changes  required. married retired Current Smoker Alcohol use-no Drug use-no Regular exercise-no  Review of Systems  The patient denies fever, weight loss, weight gain, vision loss, decreased hearing, chest pain, syncope, headaches, abdominal pain, melena, hematochezia, severe indigestion/heartburn, suspicious skin lesions, and depression.    Physical Exam  General:  normal appearance.   Head:  head: no deformity eyes: no periorbital swelling.  there is slight bilateral proptosis external nose and ears are normal mouth: no lesion seen Neck:  Supple without thyroid enlargement or tenderness.  Breasts:  No tenderness, masses, nipple discharge, or skin abnormalities.  Abdomen:  abdomen is soft, nontender.  no hepatosplenomegaly.   not distended.  no hernia  Rectal:  normal external and internal exam.  heme neg  Msk:  muscle bulk and strength are grossly normal.  no obvious joint swelling.  gait is normal and steady  Pulses:  dorsalis pedis intact bilat.  no carotid bruit  Extremities:  no deformity.  no ulcer on the feet.  feet are of normal color and temp.  no edema mycotic toenails.   Neurologic:  cn 2-12 grossly intact.   readily moves all 4's.   sensation is intact to touch on the feet  Skin:  (pt wears a wig due to sle affecting her scalp) Cervical Nodes:  No significant adenopathy.  Psych:  Alert and cooperative; normal mood and affect; normal attention span and concentration.  Additional Exam:  SEPARATE EVALUATION FOLLOWS--EACH PROBLEM HERE IS NEW, NOT RESPONDING TO TREATMENT, OR POSES SIGNIFICANT RISK TO THE PATIENT'S HEALTH: HISTORY OF THE PRESENT ILLNESS: pt says she is developing increasing doe PAST MEDICAL HISTORY reviewed and up to date today REVIEW OF SYSTEMS: denies chronic cough PHYSICAL EXAMINATION: see vs page chest:  clear to a cv:  reg rate and rhythm. no murmur LAB/XRAY RESULTS: see spirometry results.   IMPRESSION: mild asthma.  new problem PLAN: see  instruction sheet   Impression & Recommendations:  Problem # 1:  ROUTINE GENERAL MEDICAL EXAM@HEALTH  CARE FACL (ICD-V70.0)  Orders: Est. Patient 65& > ([address])  Medications Added to Medication List This Visit: 1)  Aspirin 325 Mg Tabs (Aspirin) .Marland Kitchen.. 1 once daily 2)  Hydrocodone-acetaminophen 10-325 Mg Tabs (Hydrocodone-acetaminophen) .... 1/2-1, every 4 hrs as needed for pain  Other Orders: T-Parathyroid Hormone, Intact w/ Calcium (60454-09811) Spirometry w/Graph (94010) EKG w/ Interpretation (93000) T-2 View CXR (71020TC) TLB-Lipid Panel (80061-LIPID) TLB-BMP (Basic Metabolic Panel-BMET) (80048-METABOL) TLB-CBC Platelet - w/Differential (85025-CBCD) TLB-Hepatic/Liver Function Pnl (80076-HEPATIC) TLB-TSH (Thyroid Stimulating Hormone) (84443-TSH) TLB-A1C / Hgb A1C (Glycohemoglobin) (83036-A1C) TLB-Microalbumin/Creat Ratio, Urine (82043-MALB) TLB-Udip w/ Micro (81001-URINE) Est. Patient Level III (9[address]78)  Patient Instructions: 1)  please consider these measures for your health:  minimize alcohol.  do not use tobacco products.  have a colonoscopy at least every 10 years from age 15.  keep firearms safely stored.  always use seat belts.  have working smoke alarms in your home.  see an eye doctor and dentist regularly.  never drive under the influence of alcohol or drugs (including prescription drugs).   2)  please let me know what your wishes would be, if artificial life support measures should become necessary.  it is critically important to prevent falling down (keep floor areas well-lit, dry, and free of loose objects) 3)  blood tests are being ordered for you today.  please call [phone] to hear your test results. 4)  Please schedule a follow-up appointment in 6 months. 5)  here are some samples of "symbicort-160."  take 1 puff two times a day.  rinse mouth after using. Prescriptions: SCREENING MAMMOGRAPHY   #1 x 0   Entered and Authorized by:   Minus Breeding MD   Signed by:    Minus Breeding MD on 10/19/2009   Method used:   Print then Give to Patient   RxID:   [id number] HYDROCODONE-ACETAMINOPHEN 10-325 MG TABS (HYDROCODONE-ACETAMINOPHEN) 1/2-1, every 4 hrs as needed for pain  #50 x 5   Entered and Authorized by:   Minus Breeding MD   Signed by:   Minus Breeding MD on 10/19/2009   Method used:   Print then Give to Patient   RxID:   [id number]

## 2010-04-13 NOTE — Letter (Signed)
Summary: Jennifer Ball MCD  Jennifer Ball MCD   Imported By: Sherian Rein 05/23/2009 07:39:21  _____________________________________________________________________  External Attachment:    Type:   Image     Comment:   External Document

## 2010-04-13 NOTE — Miscellaneous (Signed)
Summary: [facility]  Clinical Lists Changes  Medications: Added new medication of MOVIPREP 100 GM  SOLR (PEG-KCL-NACL-NASULF-NA ASC-C) As per prep instructions. - Signed Rx of MOVIPREP 100 GM  SOLR (PEG-KCL-NACL-NASULF-NA ASC-C) As per prep instructions.;  #1 x 0;  Signed;  Entered by: Ezra Sites RN;  Authorized by: Meryl Dare MD [facility];  Method used: Electronically to [facility]. # [id number]*, [address] [place], [place], [place]  [address], Ph: [phone] or [phone], Fax: [phone] Observations: Added new observation of ALLERGY REV: Done (05/11/2009 7:43)    Prescriptions: MOVIPREP 100 GM  SOLR (PEG-KCL-NACL-NASULF-NA ASC-C) As per prep instructions.  #1 x 0   Entered by:   Ezra Sites RN   Authorized by:   Meryl Dare MD [facility]   Signed by:   Ezra Sites RN on 05/11/2009   Method used:   Electronically to        [id number]* (retail)       [address]       [place], [place]  [address]       Ph: [phone] or [phone]       Fax: [phone]   RxID:   [id number]

## 2010-04-13 NOTE — Letter (Signed)
Summary: Patient Notice- Polyp Results  [facility]  [address] [place], [place] [address]   Phone: [phone]  Fax: [phone]        May 26, 2009 MRN: [id number]    RANEEM MENDOLIA [address] [place], [place]  [address]    Dear Ms. Newbern,  I am pleased to inform you that the colon polyp(s) removed during your recent colonoscopy was (were) found to be benign (no cancer detected) upon pathologic examination.  I recommend you have a repeat colonoscopy examination in 5 years to look for recurrent polyps, as having colon polyps increases your risk for having recurrent polyps or even colon cancer in the future.  Should you develop new or worsening symptoms of abdominal pain, bowel habit changes or bleeding from the rectum or bowels, please schedule an evaluation with either your primary care physician or with me.  Continue treatment plan as outlined the day of your exam.  Please call us if you are having persistent problems or have questions about your condition that have not been fully answered at this time.  Sincerely,  Meryl Dare MD [facility]  This letter has been electronically signed by your physician.  Appended Document: Patient Notice- Polyp Results Letter mailed 3.18.11

## 2010-04-13 NOTE — Assessment & Plan Note (Signed)
Summary: HEMORRHOID/ SAE'S PT/NWS   Vital Signs:  Patient profile:   70 year old female Height:      66 inches Weight:      190 pounds BMI:     30.78 O2 Sat:      97 % on Room air Temp:     98.2 degrees F oral Pulse rate:   94 / minute BP sitting:   130 / 86  (left arm) Cuff size:   regular  Vitals Entered ByZella Ball Ewing (March 30, 2009 8:06 AM)  O2 Flow:  Room air  CC: hemorrhoids/RE   Primary Care Provider:  Minus Breeding MD  CC:  hemorrhoids/RE.  History of Present Illness: here with large pain ful hemorrhoid for approx one wk - was large to start, then improved to today much smaller but had small amount of blood assoc and mild pain still;  Pt denies CP, sob, doe, wheezing, orthopnea, pnd, worsening LE edema, palps, dizziness or syncope    Problems Prior to Update: 1)  Constipation  (ICD-564.00) 2)  Hematochezia  (ICD-578.1) 3)  External Hemorrhoids, Thrombosed  (ICD-455.4) 4)  Weight Loss  (ICD-783.21) 5)  Encounter For Long-term Use of Other Medications  (ICD-V58.69) 6)  Arthralgia  (ICD-719.40) 7)  Contact/exposure To Other Communicable Diseases  (ICD-V01.89) 8)  Back Pain, Lumbar  (ICD-724.2) 9)  Dm  (ICD-250.00) 10)  Hypothyroidism, Post-radiation  (ICD-244.1) 11)  Hip Pain, Right  (ICD-719.45) 12)  Routine General Medical Exam@health  Care Facl  (ICD-V70.0) 13)  Sebaceous Cyst, Neck  (ICD-706.2) 14)  Leg Pain, Bilateral  (ICD-729.5) 15)  Abdominal Pain  (ICD-789.00) 16)  Cough  (ICD-786.2) 17)  Smoker  (ICD-305.1) 18)  Lupus  (ICD-710.0) 19)  Hyperlipidemia  (ICD-272.4) 20)  Osteoporosis  (ICD-733.00) 21)  Hypertension  (ICD-401.9) 22)  Colonic Polyps, Hx of  (ICD-V12.72)  Medications Prior to Update: 1)  Crestor 40 Mg Tabs (Rosuvastatin Calcium) .... Take 1 By Mouth Once Daily 2)  Klor-Con M20 20 Meq Tbcr (Potassium Chloride Crys Cr) .... Take 1 Tablet By Mouth Once A Day 3)  Maxzide-25 37.5-25 Mg Tabs (Triamterene-Hctz) .... Take 1 By Mouth Qd 4)   Screening Mammography 5)  Levothyroxine Sodium 75 Mcg Tabs (Levothyroxine Sodium) .... Qd 6)  Vicodin 5-500 Mg Tabs (Hydrocodone-Acetaminophen) .Marland Kitchen.. 1 Q4h As Needed Pain 7)  Aspirin 81 Mg Tbec (Aspirin) .Marland Kitchen.. 1 Tab Daily  Current Medications (verified): 1)  Crestor 40 Mg Tabs (Rosuvastatin Calcium) .... Take 1 By Mouth Once Daily 2)  Klor-Con M20 20 Meq Tbcr (Potassium Chloride Crys Cr) .... Take 1 Tablet By Mouth Once A Day 3)  Maxzide-25 37.5-25 Mg Tabs (Triamterene-Hctz) .... Take 1 By Mouth Qd 4)  Screening Mammography 5)  Levothyroxine Sodium 75 Mcg Tabs (Levothyroxine Sodium) .... Qd 6)  Vicodin 5-500 Mg Tabs (Hydrocodone-Acetaminophen) .Marland Kitchen.. 1 Q4h As Needed Pain 7)  Aspirin 81 Mg Tbec (Aspirin) .Marland Kitchen.. 1 Tab Daily 8)  Magnesium Oxide 400 Mg Caps (Magnesium Oxide) .Marland Kitchen.. 1po Once Daily 9)  Lidocaine-Hydrocortisone Ace 3-0.5 % Crea (Lidocaine-Hydrocortisone Ace) .... Use Asd Two Times A Day As Needed  Allergies (verified): 1)  Lisinopril (Lisinopril)  Past History:  Past Medical History: Last updated: 05/08/2007 Colonic polyps, hx of Hypertension Osteoporosis Hyperlipidemia Hypothyroidism tah/bso  Past Surgical History: Last updated: 04/06/2008 Hysterectomy and bso 1970's I-131 Therapy (06/1999)  Social History: Last updated: 12/22/2008 married retired Current Smoker Alcohol use-no Drug use-no Regular exercise-no  Risk Factors: Exercise: no (12/22/2008)  Risk Factors: Smoking Status: current (  12/22/2008) Packs/Day: 1.5 (12/22/2008)  Review of Systems       all otherwise negative per pt   Physical Exam  General:  alert and overweight-appearing.   Head:  normocephalic and atraumatic.   Eyes:  vision grossly intact, pupils equal, and pupils round.   Ears:  R ear normal and L ear normal.   Nose:  no external deformity and no nasal discharge.   Mouth:  no gingival abnormalities and pharynx pink and moist.   Neck:  supple and no masses.   Lungs:  normal  respiratory effort and normal breath sounds.   Heart:  normal rate and regular rhythm.   Abdomen:  soft, non-tender, and normal bowel sounds.   Rectal:  small ext thrombosed hemorrhoid, mild tender, no active bleeding Extremities:  no edema, no erythema    Impression & Recommendations:  Problem # 1:  EXTERNAL HEMORRHOIDS, THROMBOSED (ICD-455.4) tx with med per EMR, improving Orders: Gastroenterology Referral (GI)  Problem # 2:  HEMATOCHEZIA (ICD-578.1) likely due to above, but with hx of polyps - will direct sched colonoscopy, do not feel labs needed at this time Orders: Gastroenterology Referral (GI)  Problem # 3:  CONSTIPATION (ICD-564.00) for stool softner an mag ox daily  Complete Medication List: 1)  Crestor 40 Mg Tabs (Rosuvastatin calcium) .... Take 1 by mouth once daily 2)  Klor-con M20 20 Meq Tbcr (Potassium chloride crys cr) .... Take 1 tablet by mouth once a day 3)  Maxzide-25 37.5-25 Mg Tabs (Triamterene-hctz) .... Take 1 by mouth qd 4)  Screening Mammography  5)  Levothyroxine Sodium 75 Mcg Tabs (Levothyroxine sodium) .... Qd 6)  Vicodin 5-500 Mg Tabs (Hydrocodone-acetaminophen) .Marland Kitchen.. 1 q4h as needed pain 7)  Aspirin 81 Mg Tbec (Aspirin) .Marland Kitchen.. 1 tab daily 8)  Magnesium Oxide 400 Mg Caps (Magnesium oxide) .Marland Kitchen.. 1po once daily 9)  Lidocaine-hydrocortisone Ace 3-0.5 % Crea (Lidocaine-hydrocortisone ace) .... Use asd two times a day as needed  Patient Instructions: 1)  Please take all new medications as prescribed - the hemorrhoid cream, and the mag-ox 2)  you can also take Colace 100 mg two times a day as needed for stool softner as long as you want 3)  Continue all previous medications as before this visit  4)  You will be contacted about the referral(s) to: colonoscopy 5)  Please schedule an appointment with your primary doctor as planned at your last visit Prescriptions: LIDOCAINE-HYDROCORTISONE ACE 3-0.5 % CREA (LIDOCAINE-HYDROCORTISONE ACE) use asd two times a day  as needed  #1 x 1   Entered and Authorized by:   Corwin Levins MD   Signed by:   Corwin Levins MD on 03/30/2009   Method used:   Print then Give to Patient   RxID:   [id number] MAGNESIUM OXIDE 400 MG CAPS (MAGNESIUM OXIDE) 1po once daily  #90 x 3   Entered and Authorized by:   Corwin Levins MD   Signed by:   Corwin Levins MD on 03/30/2009   Method used:   Print then Give to Patient   RxID:   [id number]

## 2010-04-19 ENCOUNTER — Encounter: Payer: Self-pay | Admitting: Endocrinology

## 2010-04-19 ENCOUNTER — Ambulatory Visit (INDEPENDENT_AMBULATORY_CARE_PROVIDER_SITE_OTHER): Payer: MEDICARE | Admitting: Endocrinology

## 2010-04-19 DIAGNOSIS — R062 Wheezing: Secondary | ICD-10-CM

## 2010-04-27 NOTE — Assessment & Plan Note (Signed)
Summary: 6 MTH FU STC   Vital Signs:  Patient profile:   70 year old female Menstrual status:  hysterectomy Height:      66 inches (167.64 cm) Weight:      192.50 pounds (87.50 kg) BMI:     31.18 O2 Sat:      93 % on Room air Temp:     99.2 degrees F (37.33 degrees C) oral Pulse rate:   103 / minute Pulse rhythm:   regular BP sitting:   98 / 64  (left arm) Cuff size:   large  Vitals Entered By: Brenton Grills CMA (AAMA) (April 19, 2010 8:14 AM)  O2 Flow:  Room air CC: Follow-up visit/aj Is Patient Diabetic? Yes Comments pt is due for mammogram and had never had Zostavax     Menstrual Status hysterectomy   Primary Provider:  Minus Breeding MD  CC:  Follow-up visit/aj.  History of Present Illness: the status of at least 3 ongoing medical problems is addressed today: uri: pt states she feels better, but still has wheezing and prod cough. arthralgias: pt satys the vicodin was too strong.   htn:  pt is uncertain of her maxzide dosage, but says she tolerates it well.  Current Medications (verified): 1)  Crestor 40 Mg Tabs (Rosuvastatin Calcium) .... Take 1 By Mouth Once Daily 2)  Klor-Con M20 20 Meq Tbcr (Potassium Chloride Crys Cr) .... Take 1 Tablet By Mouth Once A Day 3)  Levothyroxine Sodium 75 Mcg Tabs (Levothyroxine Sodium) .... Qd 4)  Aspirin 325 Mg Tabs (Aspirin) .Marland Kitchen.. 1 Once Daily 5)  Maxzide 75-50 Mg Tabs (Triamterene-Hctz) .Marland Kitchen.. 1 By Mouth Once Daily 6)  Cephalexin 500 Mg Caps (Cephalexin) .Marland Kitchen.. 1po Three Times A Day 7)  Tessalon Perles 100 Mg Caps (Benzonatate) .Marland Kitchen.. 1-2 By Mouth Three Times A Day As Needed Cough 8)  Prednisone 10 Mg Tabs (Prednisone) .... 3po Qd For 3days, Then 2po Qd For 3days, Then 1po Qd For 3days, Then Stop  Allergies (verified): 1)  Lisinopril (Lisinopril)  Past History:  Past Medical History: Last updated: 05/08/2007 Colonic polyps, hx of Hypertension Osteoporosis Hyperlipidemia Hypothyroidism tah/bso  Review of Systems  The  patient denies syncope.         she has leg cramps  Physical Exam  General:  normal appearance.   Lungs:  Clear to auscultation bilaterally. Normal respiratory effort.  Extremities:  no edema Additional Exam:  (see spirometry results)   Impression & Recommendations:  Problem # 1:  WHEEZING (ICD-786.07) Assessment Unchanged  Problem # 2:  ARTHRALGIA (ICD-719.40) she did not tolerate vicodin  Problem # 3:  HYPERTENSION (ICD-401.9) overcontrolled  Medications Added to Medication List This Visit: 1)  Tramadol Hcl 50 Mg Tabs (Tramadol hcl) .Marland Kitchen.. 1 every 4 hrs as needed for pain 2)  Triamterene-hctz 37.5-25 Mg Tabs (Triamterene-hctz) .Marland Kitchen.. 1 tab once daily  Other Orders: EKG w/ Interpretation (93[address]) Est. Patient Level IV ([address])  Patient Instructions: 1)  take tramadol 50 mg every 4 hrs as needed for pain. 2)  reduce triamterene-hctz to 1/2 tab once daily. 3)  try this sample of "advair-100," 1 puff two times a day.  rinse mouth after using.  call if you want a prescription for this.   4)  Please schedule a regular physical appointment in 6 months. Prescriptions: TRIAMTERENE-HCTZ 37.5-25 MG TABS (TRIAMTERENE-HCTZ) 1 tab once daily  #30 x 11   Entered and Authorized by:   Minus Breeding MD   Signed by:  Minus Breeding MD on 04/19/2010   Method used:   Electronically to        [id number]* (retail)       [address]       [place], [place]  [address]       Ph: [phone] or [phone]       Fax: [phone]   RxID:   [id number] TRAMADOL HCL 50 MG TABS (TRAMADOL HCL) 1 every 4 hrs as needed for pain  #50 x 2   Entered and Authorized by:   Minus Breeding MD   Signed by:   Minus Breeding MD on 04/19/2010   Method used:   Electronically to        [facility]  [address] [id number]* (retail)       [address]       [place], [place]  [address]       Ph: [phone] or [phone]       Fax: [phone]   RxID:    [id number]    Orders Added: 1)  EKG w/ Interpretation [93000] 2)  Est. Patient Level IV [[address]]

## 2010-06-24 ENCOUNTER — Other Ambulatory Visit: Payer: Self-pay | Admitting: Endocrinology

## 2010-07-12 ENCOUNTER — Emergency Department (HOSPITAL_COMMUNITY): Payer: Medicare Other

## 2010-07-12 ENCOUNTER — Emergency Department (HOSPITAL_COMMUNITY)
Admission: EM | Admit: 2010-07-12 | Discharge: 2010-07-12 | Disposition: A | Discharge status: Home / Self Care | Payer: Medicare Other | Attending: Emergency Medicine | Admitting: Emergency Medicine

## 2010-07-12 ENCOUNTER — Encounter (HOSPITAL_COMMUNITY): Payer: Self-pay

## 2010-07-12 DIAGNOSIS — R4789 Other speech disturbances: Secondary | ICD-10-CM | POA: Insufficient documentation

## 2010-07-12 DIAGNOSIS — F17200 Nicotine dependence, unspecified, uncomplicated: Secondary | ICD-10-CM | POA: Insufficient documentation

## 2010-07-12 DIAGNOSIS — M329 Systemic lupus erythematosus, unspecified: Secondary | ICD-10-CM | POA: Insufficient documentation

## 2010-07-12 DIAGNOSIS — R5381 Other malaise: Secondary | ICD-10-CM | POA: Insufficient documentation

## 2010-07-12 DIAGNOSIS — I10 Essential (primary) hypertension: Secondary | ICD-10-CM | POA: Insufficient documentation

## 2010-07-12 DIAGNOSIS — H81399 Other peripheral vertigo, unspecified ear: Secondary | ICD-10-CM | POA: Insufficient documentation

## 2010-07-12 LAB — URINALYSIS, ROUTINE W REFLEX MICROSCOPIC
Ketones, ur: NEGATIVE mg/dL
Nitrite: NEGATIVE
Specific Gravity, Urine: 1.014 (ref 1.005–1.030)
Urobilinogen, UA: 1 mg/dL (ref 0.0–1.0)
pH: 6 (ref 5.0–8.0)

## 2010-07-12 LAB — COMPREHENSIVE METABOLIC PANEL
ALT: 15 U/L (ref 0–35)
AST: 21 U/L (ref 0–37)
Alkaline Phosphatase: 75 U/L (ref 39–117)
BUN: 13 mg/dL (ref 6–23)
Calcium: 9.4 mg/dL (ref 8.4–10.5)
GFR calc non Af Amer: >60 mL/min (ref >60)
Sodium: 139 mEq/L (ref 135–145)

## 2010-07-12 LAB — CBC
HCT: 36.9 % (ref 36.0–46.0)
Hemoglobin: 12.5 g/dL (ref 12.0–15.0)
MCH: 31.3 pg (ref 26.0–34.0)
MCHC: 33.9 g/dL (ref 30.0–36.0)
WBC: 5.9 10*3/uL (ref 4.0–10.5)

## 2010-07-12 LAB — DIFFERENTIAL
Basophils Absolute: 0 10*3/uL (ref 0.0–0.1)
Basophils Relative: 0 % (ref 0–1)
Eosinophils Absolute: 0.1 10*3/uL (ref 0.0–0.7)
Lymphocytes Relative: 15 % (ref 12–46)
Lymphs Abs: 0.9 10*3/uL (ref 0.7–4.0)
Neutro Abs: 4.4 10*3/uL (ref 1.7–7.7)

## 2010-07-14 ENCOUNTER — Encounter: Payer: Self-pay | Admitting: Endocrinology

## 2010-07-14 ENCOUNTER — Ambulatory Visit (INDEPENDENT_AMBULATORY_CARE_PROVIDER_SITE_OTHER)
Admission: RE | Admit: 2010-07-14 | Discharge: 2010-07-14 | Disposition: A | Discharge status: Home / Self Care | Payer: Medicare Other | Source: Ambulatory Visit | Attending: Endocrinology | Admitting: Endocrinology

## 2010-07-14 ENCOUNTER — Ambulatory Visit (INDEPENDENT_AMBULATORY_CARE_PROVIDER_SITE_OTHER): Payer: Medicare Other | Admitting: Endocrinology

## 2010-07-14 VITALS — BP 124/82 | HR 89 | Temp 99.1°F | Ht 69.0 in | Wt 192.4 lb

## 2010-07-14 DIAGNOSIS — M21969 Unspecified acquired deformity of unspecified lower leg: Secondary | ICD-10-CM

## 2010-07-14 NOTE — Patient Instructions (Addendum)
X-rays are being ordered for you today.  please call [phone] to hear your test results.  You will be prompted to enter the 9-digit "MRN" number that appears at the top left of this page, followed by #.  Then you will hear the message.  Your next regular physical is due in late august. (update: i left message on phone-tree:  rx as we discussed)

## 2010-07-14 NOTE — Progress Notes (Signed)
  Subjective:    Patient ID: Jennifer Ball, female    DOB: [birth year], 70 y.o.   MRN: [id number]  HPI Pt as seen in er 2 days ago for vertigo.  Since then, she feels better in general.  She also has mild diarrhea. She has 5 mos of moderate prominence of the lat aspect of the left foot, and slight assoc pain.  This started after she mistepped while walking. Past Medical History  Diagnosis Date  . HYPOTHYROIDISM, POST-RADIATION 04/06/2008  . DM 04/28/2008  . HYPERLIPIDEMIA 12/30/2006  . SMOKER 12/30/2006  . HYPERTENSION 12/30/2006  . ASTHMA 10/19/2009  . COLONIC POLYPS, HX OF 12/30/2006  . OSTEOPOROSIS 12/30/2006  . LUPUS 12/30/2006    Past Surgical History  Procedure Date  . Total abdominal hysterectomy w/ bilateral salpingoophorectomy 1970's  . I-131 therapy 06/1999    History   Social History  . Marital Status: Single    Spouse Name: N/A    Number of Children: N/A  . Years of Education: N/A   Occupational History  .      Retired   Social History Main Topics  . Smoking status: Current Everyday Smoker  . Smokeless tobacco: Not on file  . Alcohol Use: No  . Drug Use: No  . Sexually Active:    Other Topics Concern  . Not on file   Social History Narrative   Married    Current Outpatient Prescriptions on File Prior to Visit  Medication Sig Dispense Refill  . levothyroxine (SYNTHROID, LEVOTHROID) 75 MCG tablet take 1 tablet by mouth once daily  30 tablet  5    Allergies  Allergen Reactions  . Lisinopril     REACTION: unspecified    Family History  Problem Relation Age of Onset  . Cancer Mother     Pancreatic Cancer    BP 124/82  Pulse 89  Temp(Src) 99.1 F (37.3 C) (Oral)  Ht 5\' 9"  (1.753 m)  Wt 192 lb 6.4 oz (87.272 kg)  BMI 28.41 kg/m2  SpO2 94%   Review of Systems Denies loc and numbness    Objective:   Physical Exam GENERAL: no distress Ext:  No edema Gait: normal and steady. Left foot:  There is a 2 cm lateral bony prominence    X-ray  results are noted   Assessment & Plan:  Foot deformity, uncertain if related to injury Vertigo, improved. It may have been related to a viral illness.

## 2010-07-28 NOTE — Consult Note (Signed)
NAMEMICHAELIA, Ball                ACCOUNT NO.:  [id number]   MEDICAL RECORD NO.:  [id number]          PATIENT TYPE:  EMS   LOCATION:  ED                           FACILITY:  [facility]   PHYSICIAN:  Rosalyn Gess. Norins, M.D. [facility] OF BIRTH:  11-07-1940   DATE OF CONSULTATION:  04/24/2004  DATE OF DISCHARGE:                                   CONSULTATION   The patient's records in the office are under Jennifer Ball).   CHIEF COMPLAINT:  Passed out.   HISTORY OF PRESENT ILLNESS:  Jennifer Ball is a 70 year old, divorced, black  female followed by Dr. Romero Belling for hypothyroid disease, hyperlipidemia,  hypertension.  The patient has been in her usual state of health until this  a.m. When she awoke, she had a cough that was nonproductive.  She had chest  wall discomfort and pain with coughing.  She admits to having anorexia with  poor p.o. fluid intake throughout the day.  She did take two doses of  Robitussin.  The patient was in the rest room the early part of the  afternoon, was found by her husband to be slumped over with her eyes rolled  back and a decreased level of consciousness.  It took about 10 minutes for  her to arouse to a full level of consciousness.  She had no incontinence of  bowel or bladder.  She had no postictal symptoms and was fully awake.  Because of her symptoms, she was brought to [facility] Emergency Department  for evaluation.  She was initially found to be febrile at 102.1, and  minimally tachycardic with position change with stable blood pressure.  Because of these symptoms, I was asked to see the patient for evaluation in  regards to possible need for hospitalization.   PAST SURGICAL HISTORY:  1.  TAH/BSO.  2.  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   PAST MEDICAL HISTORY:  1.  Usual childhood diseases.  2.  Rheumatic fever.  3.  Discoid lupus.  4.  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  5.   Hyperlipidemia.  6.  Hypertension.  7.  Gravida 3, para 3.   MEDICATIONS:  1.  Levoxyl 50 mcg daily.  2.  Maxzide 37.5/25 daily.  3.  Metoprolol 25 mg b.i.d.  4.  Potassium mEq daily.  5.  Crestor 40 mg daily.  6.  Aspirin daily.   HABITS:  Tobacco:  1-1/2 packs per day with a greater than 40-pack-year  smoking history.  No alcohol use.   ALLERGIES:  No known drug allergies.   FAMILY HISTORY:  Positive for heart disease, positive for diabetes in her  mother. Negative for breast cancer.  Negative for colon cancer.   SOCIAL HISTORY:  The patient was married 27 years.  She has been divorced  for three years but continues to cohabitate with her husband and they are  planning to remarry this spring.  She has three children, one of whom died  as premature birth. Two daughters alive and well. She  has several step-  children.  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   PHYSICAL EXAMINATION:  VITAL SIGNS: Temperature 102.1 at 3:11 p.m. No repeat  temperature on the record but she is not hot to touch.  Blood pressure  112/71, pulse 86, respirations 20.  GENERAL APPEARANCE:  This is an overweight black female, looks her stated  age, in no acute distress.  HEENT:  Normocephalic, atraumatic.  EACs and TMs were unremarkable.  The  patient is edentulous with an upper denture in place.  No oral lesions are  noted.  Conjunctivae and sclerae slightly muddied.  Pupils equal, round and  reactive to light and accommodation.  NECK:  Supple.  There was no palpable thyromegaly was noted in the cervical  or supraclavicular regions.  CHEST:  No CVA tenderness.  Lungs were clear to auscultation or percussion.  BREASTS:  Deferred to outpatient evaluation.  CARDIOVASCULAR:  2+ radial pulses.  No JVD or carotid bruits.  She had a  quiet precordium with regular rate and rhythm without murmurs, rubs or  gallops.  ABDOMEN:  Obese, soft.  No guarding.  No rebound.  No organosplenomegaly was   noted.  There was no tenderness to palpation.  PELVIC/RECTAL:  Deferred.  EXTREMITIES:  Without clubbing, cyanosis or edema or deformity.  NEUROLOGIC:  Nonfocal.   LABORATORY DATA:  A 12-lead EKG revealed a sinus tachycardia with no other  abnormalities noted.  CT scan of the brain without contrast showed no mass  lesions or other abnormalities.  PA and lateral chest x-ray showed no active  disease with no infiltrates, no effusions.  Urinalysis was negative.  Cardiac markers point of care were negative x3.  At the time of this  dictation, CBC with differential and comprehensive panel are pending.   ASSESSMENT/PLAN:  1.  Pulmonary:  The patient with a hard cough and a longstanding smoker.      She is not having any sputum production. Would be concerned for smoker's      bronchitis, possibly atypical organism, possibly viral.  Recommendation:      Would have the patient take Biaxin XL 500 mg two tablets daily for seven      days.  Cough syrup of choice.  She should stop smoking.   1.  Syncope:  The patient had what sounds like vasovagal episode secondary      to dehydration.  She had no evidence of seizure activity.  She had no      presentation consistent with cardiac syncope and her telemetry has been      normal.  Plan:  The patient is to hydrate at home on a regular basis.      She should try to eat as tolerated.   1.  Hypertension:  The patient's blood pressure is adequately controlled.      We will have her continue her home medications.   1.  Hypothyroid disease:  Will have the patient continue her home      medications.   I have discussed this case in detail with Dr. Beverely Pace who is the on-duty EDP.  I share with him my recommendations.  If the patient's white count is  relatively normal, if her comp panel is unremarkable, would proceed with  this plan.  If there is significant laboratory abnormalities, would be happy to return to see the patient and consider inpatient admission  at that time.      MEN/MEDQ  D:  04/24/2004  T:  04/25/2004  Job:  [id number]   cc:   Gregary Signs A. Everardo All, M.D. [facility]

## 2010-09-01 ENCOUNTER — Encounter: Payer: Self-pay | Admitting: *Deleted

## 2010-10-23 ENCOUNTER — Other Ambulatory Visit: Payer: Self-pay | Admitting: Endocrinology

## 2010-12-18 ENCOUNTER — Other Ambulatory Visit: Payer: Self-pay | Admitting: Endocrinology

## 2011-02-06 ENCOUNTER — Encounter: Payer: Self-pay | Admitting: Endocrinology

## 2011-02-06 ENCOUNTER — Ambulatory Visit (INDEPENDENT_AMBULATORY_CARE_PROVIDER_SITE_OTHER): Payer: Medicare Other | Admitting: Endocrinology

## 2011-02-06 ENCOUNTER — Other Ambulatory Visit (INDEPENDENT_AMBULATORY_CARE_PROVIDER_SITE_OTHER): Payer: Medicare Other

## 2011-02-06 VITALS — BP 122/76 | HR 104 | Temp 97.7°F | Ht 67.0 in | Wt 193.0 lb

## 2011-02-06 DIAGNOSIS — M810 Age-related osteoporosis without current pathological fracture: Secondary | ICD-10-CM

## 2011-02-06 DIAGNOSIS — E785 Hyperlipidemia, unspecified: Secondary | ICD-10-CM

## 2011-02-06 DIAGNOSIS — Z23 Encounter for immunization: Secondary | ICD-10-CM

## 2011-02-06 DIAGNOSIS — E119 Type 2 diabetes mellitus without complications: Secondary | ICD-10-CM

## 2011-02-06 DIAGNOSIS — I10 Essential (primary) hypertension: Secondary | ICD-10-CM

## 2011-02-06 DIAGNOSIS — E890 Postprocedural hypothyroidism: Secondary | ICD-10-CM

## 2011-02-06 DIAGNOSIS — M21969 Unspecified acquired deformity of unspecified lower leg: Secondary | ICD-10-CM

## 2011-02-06 LAB — URINALYSIS, ROUTINE W REFLEX MICROSCOPIC
Specific Gravity, Urine: 1.02 (ref 1.000–1.030)
Total Protein, Urine: NEGATIVE
Urine Glucose: NEGATIVE
Urobilinogen, UA: 0.2 (ref 0.0–1.0)
pH: 7 (ref 5.0–8.0)

## 2011-02-06 LAB — LIPID PANEL
HDL: 56.1 mg/dL (ref >39.00)
Triglycerides: 58 mg/dL (ref 0.0–149.0)

## 2011-02-06 LAB — MICROALBUMIN / CREATININE URINE RATIO: Microalb, Ur: 0.2 mg/dL (ref 0.0–1.9)

## 2011-02-06 MED ORDER — TRAMADOL HCL 50 MG PO TABS: 50.0000 mg | ORAL_TABLET | ORAL | Status: DC | PRN

## 2011-02-06 NOTE — Progress Notes (Signed)
Subjective:    Patient ID: Jennifer Ball, female    DOB: [birth year], 70 y.o.   MRN: [id number]  HPI Pt states many years of moderate pain at both feet, and assoc numbness. She does not take maxzide.  Past Medical History  Diagnosis Date  . HYPOTHYROIDISM, POST-RADIATION 04/06/2008  . DM 04/28/2008  . HYPERLIPIDEMIA 12/30/2006  . SMOKER 12/30/2006  . HYPERTENSION 12/30/2006  . ASTHMA 10/19/2009  . COLONIC POLYPS, HX OF 12/30/2006  . OSTEOPOROSIS 12/30/2006  . LUPUS 12/30/2006    Past Surgical History  Procedure Date  . Total abdominal hysterectomy w/ bilateral salpingoophorectomy 1970's  . I-131 therapy 06/1999    History   Social History  . Marital Status: Single    Spouse Name: N/A    Number of Children: N/A  . Years of Education: N/A   Occupational History  . Retired    Social History Main Topics  . Smoking status: Current Everyday Smoker  . Smokeless tobacco: Not on file  . Alcohol Use: No  . Drug Use: No  . Sexually Active:    Other Topics Concern  . Not on file   Social History Narrative   Married    Current Outpatient Prescriptions on File Prior to Visit  Medication Sig Dispense Refill  . aspirin 325 MG tablet Take 325 mg by mouth 2 (two) times daily.        . CRESTOR 40 MG tablet take 1 tablet by mouth once daily  30 tablet  5  . KLOR-CON M20 20 MEQ tablet TAKE 1 TABLET BY MOUTH ONCE DAILY  30 tablet  5  . levothyroxine (SYNTHROID, LEVOTHROID) 75 MCG tablet take 1 tablet by mouth once daily  30 tablet  5  . triamterene-hydrochlorothiazide (MAXZIDE-25) 37.5-25 MG per tablet Take 1 tablet by mouth daily.          Allergies  Allergen Reactions  . Lisinopril     REACTION: unspecified    Family History  Problem Relation Age of Onset  . Cancer Mother     Pancreatic Cancer    BP 122/76  Pulse 104  Temp(Src) 97.7 F (36.5 C) (Oral)  Ht 5\' 7"  (1.702 m)  Wt 193 lb (87.544 kg)  BMI 30.23 kg/m2  SpO2 94%  Review of Systems Denies sob/chest  pain/weight change    Objective:   Physical Exam VITAL SIGNS:  See vs page GENERAL: no distress Pulses: dorsalis pedis intact bilat.   Feet: no deformity.  no ulcer on the feet.  feet are of normal color and temp.  no edema.  There is a 4 cm bony prominence at the lateral aspect of the left foot.  There is a heavy callus at the plantar aspect of the right foot.   Neuro: sensation is intact to touch on the feet.    Lab Results  Component Value Date   WBC 5.9 07/12/2010   HGB 12.5 07/12/2010   HCT 36.9 07/12/2010   PLT 192 07/12/2010   GLUCOSE 95 07/12/2010   CHOL 127 02/06/2011   TRIG 58.0 02/06/2011   HDL 56.10 02/06/2011   LDLCALC 59 02/06/2011   ALT 15 07/12/2010   AST 21 07/12/2010   NA 139 07/12/2010   K 3.4* 07/12/2010   CL 104 07/12/2010   CREATININE .9 07/12/2010   BUN 13 07/12/2010   CO2 25 07/12/2010   TSH 0.74 02/06/2011   HGBA1C 6.2 02/06/2011   MICROALBUR 0.2 02/06/2011      Assessment & Plan:  Foot deformity, causing pain HTN, well-controlled, off the maxzide DM, well-controlled, off any meds  We spent at least 3 minutes discussing smoking cessation--see below.

## 2011-02-06 NOTE — Patient Instructions (Addendum)
Refer to a foot specialist.  you will receive a phone call, about a day and time for an appointment. blood tests are being requested for you today.  please call [phone] to hear your test results.  You will be prompted to enter the 9-digit "MRN" number that appears at the top left of this page, followed by #.  Then you will hear the message.  Please come in for a regular physical soon.  It is ok to stay-off the triamterene/hctz, as your blood pressure is good today.    Smoking is very dangerous.  It causes many health problems, including heart problems, cancer, emphysema, and death.  Please read the paper i am giving you today.  You can get help by calling 1-800-QUIT-NOW.  It is known that smokers who use quit-smoking medication are more successful quitting than those who don't.  It often takes several tries to quit successfully, so those who have tried should try again.   i would be happy to prescribe you medication to help you quit.    Smoking Cessation: This document explains the best ways for you to quit smoking and new treatments to help. It lists new medicines that can double or triple your chances of quitting and quitting for good. It also considers ways to avoid relapses and concerns you may have about quitting, including weight gain. NICOTINE: A POWERFUL ADDICTION If you have tried to quit smoking, you know how hard it can be. It is hard because nicotine is a very addictive drug. For some people, it can be as addictive as heroin or cocaine. Usually, people make 2 or 3 tries, or more, before finally being able to quit. Each time you try to quit, you can learn about what helps and what hurts. Quitting takes hard work and a lot of effort, but you can quit smoking. QUITTING SMOKING IS ONE OF THE MOST IMPORTANT THINGS YOU WILL EVER DO.  You will live longer, feel better, and live better.     The impact on your body of quitting smoking is felt almost immediately:     Within 20 minutes, blood  pressure decreases. Pulse returns to its normal level.     After 8 hours, carbon monoxide levels in the blood return to normal. Oxygen level increases.     After 24 hours, chance of heart attack starts to decrease. Breath, hair, and body stop smelling like smoke.     After 48 hours, damaged nerve endings begin to recover. Sense of taste and smell improve.     After 72 hours, the body is virtually free of nicotine. Bronchial tubes relax and breathing becomes easier.     After 2 to 12 weeks, lungs can hold more air. Exercise becomes easier and circulation improves.     Quitting will reduce your risk of having a heart attack, stroke, cancer, or lung disease:     After 1 year, the risk of coronary heart disease is cut in half.     After 5 years, the risk of stroke falls to the same as a nonsmoker.     After 10 years, the risk of lung cancer is cut in half and the risk of other cancers decreases significantly.     After 15 years, the risk of coronary heart disease drops, usually to the level of a nonsmoker.     If you are pregnant, quitting smoking will improve your chances of having a healthy baby.     The people you live  with, especially your children, will be healthier.     You will have extra money to spend on things other than cigarettes.  FIVE KEYS TO QUITTING Studies have shown that these 5 steps will help you quit smoking and quit for good. You have the best chances of quitting if you use them together: 1. Get ready.    2. Get support and encouragement.    3. Learn new skills and behaviors.    4. Get medicine to reduce your nicotine addiction and use it correctly.    5. Be prepared for relapse or difficult situations. Be determined to continue trying to quit, even if you do not succeed at first.  1. GET READY  Set a quit date.     Change your environment.     Get rid of ALL cigarettes, ashtrays, matches, and lighters in your home, car, and place of work.     Do not let  people smoke in your home.     Review your past attempts to quit. Think about what worked and what did not.     Once you quit, do not smoke. NOT EVEN A PUFF!  2. GET SUPPORT AND ENCOURAGEMENT Studies have shown that you have a better chance of being successful if you have help. You can get support in many ways.  Tell your family, friends, and coworkers that you are going to quit and need their support. Ask them not to smoke around you.     Talk to your caregivers (doctor, dentist, nurse, pharmacist, psychologist, and/or smoking counselor).     Get individual, group, or telephone counseling and support. The more counseling you have, the better your chances are of quitting. Programs are available at [employer] and health centers. Call your local health department for information about programs in your area.     Spiritual beliefs and practices may help some smokers quit.     Quit meters are Photographer that keep track of quit statistics, such as amount of "quit-time," cigarettes not smoked, and money saved.     Many smokers find one or more of the many self-help books available useful in helping them quit and stay off tobacco.  3. LEARN NEW SKILLS AND BEHAVIORS  Try to distract yourself from urges to smoke. Talk to someone, go for a walk, or occupy your time with a task.     When you first try to quit, change your routine. Take a different route to work. Drink tea instead of coffee. Eat breakfast in a different place.     Do something to reduce your stress. Take a hot bath, exercise, or read a book.     Plan something enjoyable to do every day. Reward yourself for not smoking.     Explore interactive web-based programs that specialize in helping you quit.  4. GET MEDICINE AND USE IT CORRECTLY Medicines can help you stop smoking and decrease the urge to smoke. Combining medicine with the above behavioral methods and support can quadruple your chances  of successfully quitting smoking. The [place]. Food and Drug Administration (FDA) has approved 7 medicines to help you quit smoking. These medicines fall into 3 categories.  Nicotine replacement therapy (delivers nicotine to your body without the negative effects and risks of smoking):     Nicotine gum: Available over-the-counter.     Nicotine lozenges: Available over-the-counter.     Nicotine inhaler: Available by prescription.     Nicotine nasal spray: Available  by prescription.     Nicotine skin patches (transdermal): Available by prescription and over-the-counter.     Antidepressant medicine (helps people abstain from smoking, but how this works is unknown):     Bupropion sustained-release (SR) tablets: Available by prescription.     Nicotinic receptor partial agonist (simulates the effect of nicotine in your brain):     Varenicline tartrate tablets: Available by prescription.     Ask your caregiver for advice about which medicines to use and how to use them. Carefully read the information on the package.     Everyone who is trying to quit may benefit from using a medicine. If you are pregnant or trying to become pregnant, nursing an infant, you are under age 60, or you smoke fewer than 10 cigarettes per day, talk to your caregiver before taking any nicotine replacement medicines.     You should stop using a nicotine replacement product and call your caregiver if you experience nausea, dizziness, weakness, vomiting, fast or irregular heartbeat, mouth problems with the lozenge or gum, or redness or swelling of the skin around the patch that does not go away.     Do not use any other product containing nicotine while using a nicotine replacement product.     Talk to your caregiver before using these products if you have diabetes, heart disease, asthma, stomach ulcers, you had a recent heart attack, you have high blood pressure that is not controlled with medicine, a history of irregular  heartbeat, or you have been prescribed medicine to help you quit smoking.  5. BE PREPARED FOR RELAPSE OR DIFFICULT SITUATIONS  Most relapses occur within the first 3 months after quitting. Do not be discouraged if you start smoking again. Remember, most people try several times before they finally quit.     You may have symptoms of withdrawal because your body is used to nicotine. You may crave cigarettes, be irritable, feel very hungry, cough often, get headaches, or have difficulty concentrating.     The withdrawal symptoms are only temporary. They are strongest when you first quit, but they will go away within 10 to 14 days.  Here are some difficult situations to watch for:  Alcohol. Avoid drinking alcohol. Drinking lowers your chances of successfully quitting.     Caffeine. Try to reduce the amount of caffeine you consume. It also lowers your chances of successfully quitting.     Other smokers. Being around smoking can make you want to smoke. Avoid smokers.     Weight gain. Many smokers will gain weight when they quit, usually less than 10 pounds. Eat a healthy diet and stay active. Do not let weight gain distract you from your main goal, quitting smoking. Some medicines that help you quit smoking may also help delay weight gain. You can always lose the weight gained after you quit.     Bad mood or depression. There are a lot of ways to improve your mood other than smoking.  If you are having problems with any of these situations, talk to your caregiver. SPECIAL SITUATIONS AND CONDITIONS Studies suggest that everyone can quit smoking. Your situation or condition can give you a special reason to quit.  Pregnant women/new mothers: By quitting, you protect your baby's health and your own.     Hospitalized patients: By quitting, you reduce health problems and help healing.     Heart attack patients: By quitting, you reduce your risk of a second heart attack.  Lung, head, and neck  cancer patients: By quitting, you reduce your chance of a second cancer.     Parents of children and adolescents: By quitting, you protect your children from illnesses caused by secondhand smoke.  QUESTIONS TO THINK ABOUT Think about the following questions before you try to stop smoking. You may want to talk about your answers with your caregiver.  Why do you want to quit?     If you tried to quit in the past, what helped and what did not?     What will be the most difficult situations for you after you quit? How will you plan to handle them?     Who can help you through the tough times? Your family? Friends? Caregiver?     What pleasures do you get from smoking? What ways can you still get pleasure if you quit?  Here are some questions to ask your caregiver:  How can you help me to be successful at quitting?     What medicine do you think would be best for me and how should I take it?     What should I do if I need more help?     What is smoking withdrawal like? How can I get information on withdrawal?  Quitting takes hard work and a lot of effort, but you can quit smoking. FOR MORE INFORMATION   Smokefree.gov (http://www.davis-sullivan.com/) provides free, accurate, evidence-based information and professional assistance to help support the immediate and long-term needs of people trying to quit smoking. Document Released: 02/20/2001 Document Revised: 11/08/2010 Document Reviewed: 12/13/2008 [facility] Patient Information 2012 [place], [place].    (update: i left message on phone-tree:  rx as we discussed)

## 2011-02-07 LAB — PTH, INTACT AND CALCIUM
Calcium, Total (PTH): 9.4 mg/dL (ref 8.4–10.5)
PTH: 31.3 pg/mL (ref 14.0–72.0)

## 2011-04-15 ENCOUNTER — Other Ambulatory Visit: Payer: Self-pay | Admitting: Endocrinology

## 2011-05-11 ENCOUNTER — Other Ambulatory Visit (INDEPENDENT_AMBULATORY_CARE_PROVIDER_SITE_OTHER): Payer: Medicare Other

## 2011-05-11 ENCOUNTER — Encounter: Payer: Self-pay | Admitting: Internal Medicine

## 2011-05-11 ENCOUNTER — Ambulatory Visit (INDEPENDENT_AMBULATORY_CARE_PROVIDER_SITE_OTHER): Payer: Medicare Other | Admitting: Internal Medicine

## 2011-05-11 VITALS — BP 110/68 | HR 92 | Temp 98.2°F | Ht 69.0 in | Wt 196.0 lb

## 2011-05-11 DIAGNOSIS — R300 Dysuria: Secondary | ICD-10-CM | POA: Insufficient documentation

## 2011-05-11 DIAGNOSIS — M549 Dorsalgia, unspecified: Secondary | ICD-10-CM | POA: Insufficient documentation

## 2011-05-11 DIAGNOSIS — I10 Essential (primary) hypertension: Secondary | ICD-10-CM

## 2011-05-11 LAB — URINALYSIS, ROUTINE W REFLEX MICROSCOPIC
Ketones, ur: NEGATIVE
Leukocytes, UA: NEGATIVE
Specific Gravity, Urine: 1.025 (ref 1.000–1.030)
Urobilinogen, UA: 0.2 (ref 0.0–1.0)

## 2011-05-11 MED ORDER — CEPHALEXIN 500 MG PO CAPS: 500.0000 mg | ORAL_CAPSULE | Freq: Four times a day (QID) | ORAL | Status: AC

## 2011-05-11 NOTE — Patient Instructions (Addendum)
Take all new medications as prescribed Continue all other medications as before Please go to LAB in the Basement for the urine tests to be done today Please call the phone number [phone] (the PhoneTree System) for results of testing in 2-3 days;  When calling, simply dial the number, and when prompted enter the MRN number above (the Medical Record Number) and the # key, then the message should start.  

## 2011-05-12 ENCOUNTER — Encounter: Payer: Self-pay | Admitting: Internal Medicine

## 2011-05-12 NOTE — Assessment & Plan Note (Signed)
?   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

## 2011-05-12 NOTE — Progress Notes (Signed)
Subjective:    Patient ID: Jennifer Ball, female    DOB: 03/22/1940, 71 y.o.   MRN: [id number]  HPI  Here with husband, c/o 2 day onset bilat lateal abd soreness, gaseous feeling to upper abdomen, fatigue, nauseas yesterday but less today, no vomiitng, no radiation, laxative and MOM may have helped, but also has nocturia and urgency, wihtout dysuria, freq or hematuira, fever, flank pain or fever.  Pt continues to have recurring LBP without change in severity, bowel or bladder change, fever, wt loss,  worsening LE pain/numbness/weakness, gait change or falls.  Pt denies chest pain, increased sob or doe, wheezing, orthopnea, PND, increased LE swelling, palpitations, dizziness or syncope.  Pt denies new neurological symptoms such as new headache, or facial or extremity weakness or numbness   Pt denies polydipsia, polyuria,  Past Medical History  Diagnosis Date  . HYPOTHYROIDISM, POST-RADIATION 04/06/2008  . DM 04/28/2008  . HYPERLIPIDEMIA 12/30/2006  . SMOKER 12/30/2006  . HYPERTENSION 12/30/2006  . ASTHMA 10/19/2009  . COLONIC POLYPS, HX OF 12/30/2006  . OSTEOPOROSIS 12/30/2006  . LUPUS 12/30/2006   Past Surgical History  Procedure Date  . Total abdominal hysterectomy w/ bilateral salpingoophorectomy 1970's  . I-131 therapy 06/1999    reports that she has been smoking.  She does not have any smokeless tobacco history on file. She reports that she does not drink alcohol or use illicit drugs. family history includes Cancer in her mother. Allergies  Allergen Reactions  . Lisinopril     REACTION: unspecified   Current Outpatient Prescriptions on File Prior to Visit  Medication Sig Dispense Refill  . aspirin 325 MG tablet Take 325 mg by mouth 2 (two) times daily.        . CRESTOR 40 MG tablet take 1 tablet by mouth once daily  30 tablet  5  . KLOR-CON M20 20 MEQ tablet TAKE 1 TABLET BY MOUTH ONCE DAILY  30 tablet  5  . levothyroxine (SYNTHROID, LEVOTHROID) 75 MCG tablet take 1 tablet by mouth  once daily  30 tablet  5  . traMADol (ULTRAM) 50 MG tablet Take 1 tablet (50 mg total) by mouth every 4 (four) hours as needed. For pain  100 tablet  11  . triamterene-hydrochlorothiazide (MAXZIDE-25) 37.5-25 MG per tablet take 1 tablet by mouth once daily  30 tablet  5   Review of Systems Review of Systems  Constitutional: Negative for diaphoresis and unexpected weight change.  HENT: Negative for drooling and tinnitus.   Eyes: Negative for photophobia and visual disturbance.  Respiratory: Negative for choking and stridor.   Gastrointestinal: Negative for vomiting and blood in stool.  Genitourinary: Negative for hematuria and decreased urine volume.  Musculoskeletal: Negative for gait problem.  Skin: Negative for color change and wound.    Objective:   Physical Exam BP 110/68  Pulse 92  Temp(Src) 98.2 F (36.8 C) (Oral)  Ht 5\' 9"  (1.753 m)  Wt 196 lb (88.905 kg)  BMI 28.94 kg/m2  SpO2 96% Physical Exam  VS noted, uncomfortable but not ill appearing Constitutional: Pt appears well-developed and well-nourished.  HENT: Head: Normocephalic.  Right Ear: External ear normal.  Left Ear: External ear normal.  Eyes: Conjunctivae and EOM are normal. Pupils are equal, round, and reactive to light.  Neck: Normal range of motion. Neck supple.  Cardiovascular: Normal rate and regular rhythm.   Pulmonary/Chest: Effort normal and breath sounds normal.  Abd:  Soft, NT, non-distended, + BS, except for mild low mid abd  tender, without guarding or rebound Neurological: Pt is alert. No cranial nerve deficit.  Skin: Skin is warm. No erythema.  Psychiatric: Pt behavior is normal. Thought content normal. 1+ nervous    Assessment & Plan:

## 2011-05-12 NOTE — Assessment & Plan Note (Signed)
possbile UTI, for urine study, empiric antibx,  to f/u any worsening symptoms or concerns

## 2011-05-12 NOTE — Assessment & Plan Note (Signed)
stable overall by hx and exam, most recent data reviewed with pt, and pt to continue medical treatment as before  BP Readings from Last 3 Encounters:  05/11/11 110/68  02/06/11 122/76  07/14/10 124/82

## 2011-05-13 LAB — URINE CULTURE: Organism ID, Bacteria: NO GROWTH

## 2011-06-15 ENCOUNTER — Other Ambulatory Visit: Payer: Self-pay | Admitting: Endocrinology

## 2011-07-24 ENCOUNTER — Encounter: Payer: Self-pay | Admitting: Endocrinology

## 2011-07-24 ENCOUNTER — Other Ambulatory Visit (INDEPENDENT_AMBULATORY_CARE_PROVIDER_SITE_OTHER): Payer: Medicare Other

## 2011-07-24 ENCOUNTER — Ambulatory Visit (INDEPENDENT_AMBULATORY_CARE_PROVIDER_SITE_OTHER): Payer: Medicare Other | Admitting: Endocrinology

## 2011-07-24 VITALS — BP 108/72 | HR 81 | Temp 97.0°F | Ht 67.0 in | Wt 191.0 lb

## 2011-07-24 DIAGNOSIS — I10 Essential (primary) hypertension: Secondary | ICD-10-CM

## 2011-07-24 DIAGNOSIS — E119 Type 2 diabetes mellitus without complications: Secondary | ICD-10-CM

## 2011-07-24 DIAGNOSIS — M2550 Pain in unspecified joint: Secondary | ICD-10-CM

## 2011-07-24 LAB — BASIC METABOLIC PANEL
CO2: 26 mEq/L (ref 19–32)
Chloride: 104 mEq/L (ref 96–112)
Sodium: 139 mEq/L (ref 135–145)

## 2011-07-24 NOTE — Patient Instructions (Addendum)
Refer back to dr Kellie Simmering.  you will receive a phone call, about a day and time for an appointment. blood tests are being requested for you today.  You will receive a letter with results. Reduce the triamterene and potassium both to just 1 pill 3x a week (mon, wed, fri).

## 2011-07-24 NOTE — Progress Notes (Signed)
  Subjective:    Patient ID: Jennifer Ball, female    DOB: September 07, 1940, 71 y.o.   MRN: [id number]  HPI Pt states 10 days of moderate pain at the right hip, but no assoc numbness.   She c/o fatigue.   She takes maxzide and klor, both only 1/2 tab qd. Past Medical History  Diagnosis Date  . HYPOTHYROIDISM, POST-RADIATION 04/06/2008  . DM 04/28/2008  . HYPERLIPIDEMIA 12/30/2006  . SMOKER 12/30/2006  . HYPERTENSION 12/30/2006  . ASTHMA 10/19/2009  . COLONIC POLYPS, HX OF 12/30/2006  . OSTEOPOROSIS 12/30/2006  . LUPUS 12/30/2006    Past Surgical History  Procedure Date  . Total abdominal hysterectomy w/ bilateral salpingoophorectomy 1970's  . I-131 therapy 06/1999    History   Social History  . Marital Status: Single    Spouse Name: N/A    Number of Children: N/A  . Years of Education: N/A   Occupational History  . Retired    Social History Main Topics  . Smoking status: Current Everyday Smoker  . Smokeless tobacco: Not on file  . Alcohol Use: No  . Drug Use: No  . Sexually Active:    Other Topics Concern  . Not on file   Social History Narrative   Married    Current Outpatient Prescriptions on File Prior to Visit  Medication Sig Dispense Refill  . aspirin 325 MG tablet Take 325 mg by mouth 2 (two) times daily.        . CRESTOR 40 MG tablet take 1 tablet by mouth once daily  30 tablet  5  . levothyroxine (SYNTHROID, LEVOTHROID) 75 MCG tablet take 1 tablet by mouth once daily  30 tablet  5  . potassium chloride SA (KLOR-CON M20) 20 MEQ tablet 1/2 tab 3x a week (m,w,f)      . traMADol (ULTRAM) 50 MG tablet Take 1 tablet (50 mg total) by mouth every 4 (four) hours as needed. For pain  100 tablet  11  . triamterene-hydrochlorothiazide (MAXZIDE-25) 37.5-25 MG per tablet 1/2 tab 3x a week (m,w,f)        Allergies  Allergen Reactions  . Lisinopril     REACTION: unspecified    Family History  Problem Relation Age of Onset  . Cancer Mother     Pancreatic Cancer     BP 108/72  Pulse 81  Temp(Src) 97 F (36.1 C) (Oral)  Ht 5\' 7"  (1.702 m)  Wt 191 lb (86.637 kg)  BMI 29.91 kg/m2  SpO2 96%   Review of Systems Denies rash and fever    Objective:   Physical Exam VITAL SIGNS:  See vs page GENERAL: no distress Right hip: nontender Gait: favors RLE       Assessment & Plan:  Hip pain, new HTN, well-controlled Fatigue, possibly due to maxzide

## 2011-07-25 ENCOUNTER — Telehealth: Payer: Self-pay | Admitting: *Deleted

## 2011-07-25 NOTE — Telephone Encounter (Signed)
Called pt to inform of lab results, left message for pt to callback office (letter also mailed to pt). 

## 2011-07-26 NOTE — Telephone Encounter (Signed)
Pt informed of lab results. 

## 2011-08-31 ENCOUNTER — Ambulatory Visit (INDEPENDENT_AMBULATORY_CARE_PROVIDER_SITE_OTHER): Payer: 59 | Admitting: Internal Medicine

## 2011-08-31 ENCOUNTER — Encounter: Payer: Self-pay | Admitting: Internal Medicine

## 2011-08-31 ENCOUNTER — Other Ambulatory Visit: Payer: 59

## 2011-08-31 ENCOUNTER — Telehealth: Payer: Self-pay | Admitting: Endocrinology

## 2011-08-31 VITALS — BP 132/80 | HR 78 | Temp 97.7°F | Ht 66.0 in | Wt 191.2 lb

## 2011-08-31 DIAGNOSIS — I10 Essential (primary) hypertension: Secondary | ICD-10-CM

## 2011-08-31 DIAGNOSIS — R310 Gross hematuria: Secondary | ICD-10-CM

## 2011-08-31 DIAGNOSIS — R319 Hematuria, unspecified: Secondary | ICD-10-CM

## 2011-08-31 DIAGNOSIS — M5416 Radiculopathy, lumbar region: Secondary | ICD-10-CM | POA: Insufficient documentation

## 2011-08-31 DIAGNOSIS — Z87442 Personal history of urinary calculi: Secondary | ICD-10-CM

## 2011-08-31 DIAGNOSIS — IMO0002 Reserved for concepts with insufficient information to code with codable children: Secondary | ICD-10-CM

## 2011-08-31 HISTORY — DX: Personal history of urinary calculi: Z87.442

## 2011-08-31 LAB — POCT URINALYSIS DIPSTICK
Bilirubin, UA: NEGATIVE
Glucose, UA: NEGATIVE
Ketones, UA: NEGATIVE
Leukocytes, UA: NEGATIVE

## 2011-08-31 MED ORDER — CEPHALEXIN 500 MG PO CAPS: 500.0000 mg | ORAL_CAPSULE | Freq: Four times a day (QID) | ORAL | Status: AC

## 2011-08-31 NOTE — Patient Instructions (Addendum)
The urine specimen will be sent for culture Take all new medications as prescribed - the antibiotic If the culture is negative, you will need to see Urology for the blood in the urine or CT scan of the abdomen/pelvis You will be contacted regarding the referral for: MRI for the lower back, and Neurosurgury referral to see if other treatment needed

## 2011-08-31 NOTE — Telephone Encounter (Signed)
Caller: Beatrice/Patient; PCP: Romero Belling;  Call regarding Blood in Urine-onset 08/31/11. Pain in R side of back and R leg for past week; She caught some urine in a glass this morning and urine pink with tiny black seedlike material in urine . Afebrile. Slight burning with urination. Triage and Care advice per [facility]y Urine Protocol and APPOINTMENT ADVISED WITHIN 4 HOURS FOR "FELT SOMETHING PASS WITH URINATION". Advised patient that office will call back with appnt time.  CB#: [phone]

## 2011-08-31 NOTE — Telephone Encounter (Signed)
OK to add-on per [facility]

## 2011-09-01 ENCOUNTER — Encounter: Payer: Self-pay | Admitting: Internal Medicine

## 2011-09-01 NOTE — Assessment & Plan Note (Signed)
etiolgoy unclear, ? UTI- for urine studeis, cephalexin course, but if cx neg should consider urology and/or CT eval as well

## 2011-09-01 NOTE — Assessment & Plan Note (Signed)
stable overall by hx and exam, most recent data reviewed with pt, and pt to continue medical treatment as before BP Readings from Last 3 Encounters:  08/31/11 132/80  07/24/11 108/72  05/11/11 110/68

## 2011-09-01 NOTE — Assessment & Plan Note (Signed)
New onset, mild persitent with neuro changes, for MRI and NS referral

## 2011-09-01 NOTE — Progress Notes (Signed)
Subjective:    Patient ID: Jennifer Ball, female    DOB: 27-Dec-1940, 71 y.o.   MRN: [id number]  HPI Here with back pain and pain;  Specifically has 2-3 days onset feeling mild feverish, lower abd discomfort, several episodes BRB in urine and ? Mild urgency, but denies freq, dysuria, n/v, chills. Has hx of renal stone, no flank pain.  Does incidentally have onset 1 wk right LBP, mild to mod, persistent/constant, with radiation to the RLQ and distal RLE with numbness/weakness, mild but not improving.  No bowel or bladder change, fever, wt loss, or falls.  Last CT abd neg for stone.  Pt denies chest pain, increased sob or doe, wheezing, orthopnea, PND, increased LE swelling, palpitations, dizziness or syncope.   Pt denies polydipsia, polyuria. Past Medical History  Diagnosis Date  . HYPOTHYROIDISM, POST-RADIATION 04/06/2008  . DM 04/28/2008  . HYPERLIPIDEMIA 12/30/2006  . SMOKER 12/30/2006  . HYPERTENSION 12/30/2006  . ASTHMA 10/19/2009  . COLONIC POLYPS, HX OF 12/30/2006  . OSTEOPOROSIS 12/30/2006  . LUPUS 12/30/2006  . History of nephrolithiasis 08/31/2011   Past Surgical History  Procedure Date  . Total abdominal hysterectomy w/ bilateral salpingoophorectomy 1970's  . I-131 therapy 06/1999    reports that she has been smoking.  She does not have any smokeless tobacco history on file. She reports that she does not drink alcohol or use illicit drugs. family history includes Cancer in her mother. Allergies  Allergen Reactions  . Lisinopril     REACTION: unspecified   Current Outpatient Prescriptions on File Prior to Visit  Medication Sig Dispense Refill  . aspirin 325 MG tablet Take 325 mg by mouth 2 (two) times daily.        . CRESTOR 40 MG tablet take 1 tablet by mouth once daily  30 tablet  5  . levothyroxine (SYNTHROID, LEVOTHROID) 75 MCG tablet take 1 tablet by mouth once daily  30 tablet  5  . potassium chloride SA (KLOR-CON M20) 20 MEQ tablet 1/2 tab 3x a week (m,w,f)      .  traMADol (ULTRAM) 50 MG tablet Take 1 tablet (50 mg total) by mouth every 4 (four) hours as needed. For pain  100 tablet  11  . triamterene-hydrochlorothiazide (MAXZIDE-25) 37.5-25 MG per tablet 1/2 tab 3x a week (m,w,f)       Review of Systems Review of Systems  Constitutional: Negative for diaphoresis and unexpected weight change.  HENT: Negative for drooling and tinnitus.   Eyes: Negative for photophobia and visual disturbance.  Respiratory: Negative for choking and stridor.   Gastrointestinal: Negative for vomiting and blood in stool.   Musculoskeletal: Negative for gait problem.  Skin: Negative for color change and wound.  Neurological: Negative for tremors and numbness.  Objective:   Physical Exam BP 132/80  Pulse 78  Temp 97.7 F (36.5 C) (Oral)  Ht 5\' 6"  (1.676 m)  Wt 191 lb 4 oz (86.75 kg)  BMI 30.87 kg/m2  SpO2 95% Physical Exam  VS noted Constitutional: Pt appears well-developed and well-nourished.  HENT: Head: Normocephalic.  Right Ear: External ear normal.  Left Ear: External ear normal.  Eyes: Conjunctivae and EOM are normal. Pupils are equal, round, and reactive to light.  Neck: Normal range of motion. Neck supple.  Cardiovascular: Normal rate and regular rhythm.   Pulmonary/Chest: Effort normal and breath sounds normal.  Abd:  Soft, NT, non-distended, + BS except for mild mid/right abd tender, without guarding or rebound Spine: nontender, no lumbar  paravertebral spasm or tender Neurological: Pt is alert. Not confused;  LE 4+/5 RLE diffuse weakness, with decreased sens to LT, dtr intact Skin: Skin is warm. No erythema.  Psychiatric: Pt behavior is normal. Thought content normal. Not nervous    Assessment & Plan:

## 2011-09-02 ENCOUNTER — Other Ambulatory Visit: Payer: Self-pay | Admitting: Internal Medicine

## 2011-09-02 DIAGNOSIS — R310 Gross hematuria: Secondary | ICD-10-CM

## 2011-09-20 ENCOUNTER — Ambulatory Visit
Admission: RE | Admit: 2011-09-20 | Discharge: 2011-09-20 | Disposition: A | Discharge status: Home / Self Care | Payer: 59 | Source: Ambulatory Visit | Attending: Internal Medicine | Admitting: Internal Medicine

## 2011-09-20 DIAGNOSIS — M5416 Radiculopathy, lumbar region: Secondary | ICD-10-CM

## 2011-09-21 ENCOUNTER — Encounter: Payer: Self-pay | Admitting: Internal Medicine

## 2011-10-17 ENCOUNTER — Other Ambulatory Visit: Payer: Self-pay | Admitting: Endocrinology

## 2011-11-07 ENCOUNTER — Other Ambulatory Visit: Payer: Self-pay | Admitting: Urology

## 2011-11-07 MED ORDER — MITOMYCIN CHEMO FOR BLADDER INSTILLATION 40 MG: 40.0000 mg | Freq: Once | INTRAVENOUS | Status: AC

## 2011-12-10 ENCOUNTER — Ambulatory Visit: Payer: 59 | Admitting: Internal Medicine

## 2011-12-15 ENCOUNTER — Other Ambulatory Visit: Payer: Self-pay | Admitting: Endocrinology

## 2011-12-17 ENCOUNTER — Ambulatory Visit (INDEPENDENT_AMBULATORY_CARE_PROVIDER_SITE_OTHER): Payer: Medicaid Other | Admitting: Internal Medicine

## 2011-12-17 ENCOUNTER — Encounter: Payer: Self-pay | Admitting: Internal Medicine

## 2011-12-17 VITALS — BP 124/92 | HR 121 | Temp 97.2°F | Ht 67.0 in | Wt 181.4 lb

## 2011-12-17 DIAGNOSIS — I10 Essential (primary) hypertension: Secondary | ICD-10-CM

## 2011-12-17 DIAGNOSIS — Z23 Encounter for immunization: Secondary | ICD-10-CM

## 2011-12-17 DIAGNOSIS — E785 Hyperlipidemia, unspecified: Secondary | ICD-10-CM

## 2011-12-17 DIAGNOSIS — E119 Type 2 diabetes mellitus without complications: Secondary | ICD-10-CM

## 2011-12-17 MED ORDER — POTASSIUM CHLORIDE ER 10 MEQ PO TBCR: 10.0000 meq | EXTENDED_RELEASE_TABLET | Freq: Every day | ORAL | Status: DC

## 2011-12-17 NOTE — Assessment & Plan Note (Signed)
stable overall by hx and exam, most recent data reviewed with pt, and pt to continue medical treatment as before BP Readings from Last 3 Encounters:  12/17/11 124/92  08/31/11 132/80  07/24/11 108/72

## 2011-12-17 NOTE — Assessment & Plan Note (Signed)
stable overall by hx and exam, most recent data reviewed with pt, and pt to continue medical treatment as before Lab Results  Component Value Date   HGBA1C 6.1 07/24/2011

## 2011-12-17 NOTE — Assessment & Plan Note (Signed)
stable overall by hx and exam, most recent data reviewed with pt, and pt to continue medical treatment as before Lab Results  Component Value Date   LDLCALC 59 02/06/2011

## 2011-12-17 NOTE — Progress Notes (Signed)
Subjective:    Patient ID: Jennifer Ball, female    DOB: 12-May-1940, 71 y.o.   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   Pt denies chest pain, increased sob or doe, wheezing, orthopnea, PND, increased LE swelling, palpitations, dizziness or syncope.  Pt denies new neurological symptoms such as new headache, or facial or extremity weakness or numbness   Pt denies polydipsia, polyuria, or low sugar symptoms such as weakness or confusion improved with po intake.  Pt states overall good compliance with meds, trying to follow lower cholesterol  diet, wt overall stable but little exercise however.  Last a1c normal 4 mo ago.  Needs K supp refill done today.   Pt denies fever, wt loss, night sweats, loss of appetite, or other constitutional symptoms Past Medical History  Diagnosis Date  . HYPOTHYROIDISM, POST-RADIATION 04/06/2008  . DM 04/28/2008  . HYPERLIPIDEMIA 12/30/2006  . SMOKER 12/30/2006  . HYPERTENSION 12/30/2006  . ASTHMA 10/19/2009  . COLONIC POLYPS, HX OF 12/30/2006  . OSTEOPOROSIS 12/30/2006  . LUPUS 12/30/2006  . History of nephrolithiasis 08/31/2011   Past Surgical History  Procedure Date  . Total abdominal hysterectomy w/ bilateral salpingoophorectomy 1970's  . I-131 therapy 06/1999    reports that she has been smoking.  She does not have any smokeless tobacco history on file. She reports that she does not drink alcohol or use illicit drugs. family history includes Cancer in her mother. Allergies  Allergen Reactions  . Lisinopril     REACTION: unspecified   Current Outpatient Prescriptions on File Prior to Visit  Medication Sig Dispense Refill  . CRESTOR 40 MG tablet take 1 tablet by mouth once daily  30 tablet  5  .  levothyroxine (SYNTHROID, LEVOTHROID) 75 MCG tablet take 1 tablet by mouth once daily  30 tablet  5  . traMADol (ULTRAM) 50 MG tablet Take 1 tablet (50 mg total) by mouth every 4 (four) hours as needed. For pain  100 tablet  11  . triamterene-hydrochlorothiazide (MAXZIDE-25) 37.5-25 MG per tablet Take 1/2 tablet three times a week (MWF)  20 tablet  3  . aspirin 325 MG tablet Take 325 mg by mouth 2 (two) times daily.        . potassium chloride (KLOR-CON 10) 10 MEQ tablet Take 1 tablet (10 mEq total) by mouth daily.  15 tablet  11   Current Facility-Administered Medications on File Prior to Visit  Medication Dose Route Frequency Provider Last Rate Last Dose  . mitomycin (MUTAMYCIN) chemo injection 40 mg  40 mg Bladder Instillation Once [place]ett [place], MD       Review of Systems  Constitutional: Negative for diaphoresis and unexpected weight change.  HENT: Negative for tinnitus.   Eyes: Negative for photophobia and visual disturbance.  Respiratory: Negative for choking and stridor.   Gastrointestinal: Negative for vomiting and blood in stool.  Genitourinary: Negative for recent hematuria and decreased urine volume.  Musculoskeletal: Negative for gait problem.  Skin: Negative for color change and wound.  Neurological: Negative for tremors and numbness.  Psychiatric/Behavioral: Negative for decreased concentration. The patient is not hyperactive.       Objective:   Physical Exam BP 124/92  Pulse 121  Temp 97.2 F (36.2 C) (Oral)  Ht 5\' 7"  (1.702 m)  Wt 181 lb 6 oz (82.271 kg)  BMI 28.41 kg/m2  SpO2 94% Physical Exam  VS noted Constitutional: Pt appears well-developed and well-nourished.  HENT: Head: Normocephalic.  Right Ear: External ear normal.  Left Ear: External ear normal.  Eyes: Conjunctivae and EOM are normal. Pupils are equal, round, and reactive to light.  Neck: Normal range of motion. Neck supple.  Cardiovascular: Normal rate and regular rhythm.   Pulmonary/Chest:  Effort normal and breath sounds normal.  Neurological: Pt is alert. Not confused  Skin: Skin is warm. No erythema.  Psychiatric: Pt behavior is normal. Thought content normal.     Assessment & Plan:

## 2011-12-17 NOTE — Patient Instructions (Addendum)
You had the flu shot today Continue all other medications as before, except to hold on the Aspirin until the procedure Oct 14 as you are doing Please return in 3 months, or sooner if needed

## 2011-12-19 ENCOUNTER — Encounter (HOSPITAL_BASED_OUTPATIENT_CLINIC_OR_DEPARTMENT_OTHER): Payer: Self-pay | Admitting: *Deleted

## 2011-12-19 NOTE — Progress Notes (Signed)
NPO AFTER MN. ARRIVES AT 0730. NEEDS ISTAT AND EKG. WILL TAKE CRESTOR AND SYNTHROID AM OF SURG W/ SIP OF WATER.

## 2011-12-21 NOTE — H&P (Signed)
History of Present Illness            Gross hematuria: Jennifer Ball experienced gross hematuria and reported back pain as well as lower abdominal pain. In fact what Jennifer Ball describes to me as having some pain in her hip region. Jennifer Ball saw Dr. Kellie Simmering for that and received an injection in her hip. Jennifer Ball said Jennifer Ball was working outside doing shoveling before Jennifer Ball saw the hematuria. That was about 2 months ago and initially Jennifer Ball saw some blood spotting on her tissue when Jennifer Ball wiped but then Jennifer Ball urinated into a container and noted red blood. Jennifer Ball's never had hematuria in the past. Jennifer Ball denied any true localizing flank pain or suprapubic discomfort and also no dysuria. Jennifer Ball reports that Jennifer Ball may have had a kidney stone in the distant past. Jennifer Ball does have a 52-pack-year smoking history as a risk factor for urothelial carcinoma.  Jennifer Ball underwent a contrasted CT scan in 2/10 that revealed no renal masses, hydronephrosis or stones. An MRI scan was done in 7/13 but also revealed no renal masses or hydronephrosis.   Interval history: Jennifer Ball has not seen any further gross hematuria.   Past Medical History Problems  1. History of  Asthma 493.90 2. History of  Diabetes Mellitus 250.00 3. History of  Hyperlipidemia 272.4 4. History of  Hypertension 401.9 5. History of  Hypothyroidism 244.9 6. History of  Osteoporosis 733.00 7. History of  Reported A Previous Heart Murmur  Surgical History Problems  1. History of  Salpingo-oophorectomy Bilateral 2. History of  Total Abdominal Hysterectomy V45.77  Current Meds 1. Aspirin 325 MG Oral Tablet; Therapy: (Recorded:06Aug2013) to 2. Crestor 40 MG Oral Tablet; Therapy: 04Feb2013 to 3. Klor-Con M20 20 MEQ Oral Tablet Extended Release; Therapy: 04Feb2013 to 4. Levothyroxine Sodium 75 MCG Oral Tablet; Therapy: 05Apr2013 to 5. Maxzide-25 37.5-25 MG Oral Tablet; Therapy: (Recorded:06Aug2013) to  Allergies Medication  1. No Known Drug Allergies  Family History Problems  1. Family history of   No Significant Family History  Social History Problems  1. Former Smoker V15.82 11/2ppdx35 years 2. Marital History - Single Denied  3. History of  Alcohol Use 4. History of  Caffeine Use  Review of Systems Genitourinary, constitutional, skin, eye, otolaryngeal, hematologic/lymphatic, cardiovascular, pulmonary, endocrine, musculoskeletal, gastrointestinal, neurological and psychiatric system(s) were reviewed and pertinent findings if present are noted.  Genitourinary: urinary frequency, nocturia and hematuria.  Gastrointestinal: constipation.  Constitutional: night sweats.  Integumentary: skin rash/lesion.  Hematologic/Lymphatic: a tendency to easily bruise.  Musculoskeletal: back pain and joint pain.    Vitals Vital Signs  BMI Calculated: 30.13 BSA Calculated: 1.99 Height: 5 ft 7 in Weight: 192 lb  Blood Pressure: 129 / 76 Heart Rate: 82  Physical Exam Constitutional: Well nourished and well developed . No acute distress.  ENT:. The ears and nose are normal in appearance.  Neck: The appearance of the neck is normal and no neck mass is present.  Pulmonary: No respiratory distress and normal respiratory rhythm and effort.  Cardiovascular: Heart rate and rhythm are normal . No peripheral edema.  Abdomen: The abdomen is soft and nontender. No masses are palpated. No CVA tenderness. No hernias are palpable. No hepatosplenomegaly noted.  Lymphatics: The femoral and inguinal nodes are not enlarged or tender.  Skin: Normal skin turgor, no visible rash and no visible skin lesions.  Neuro/Psych:. Mood and affect are appropriate.   AU CT-HEMATURIA PROTOCOL  Test Name Result Flag Reference ** RADIOLOGY REPORT BY [facility] **   *RADIOLOGY  REPORT*  Clinical Data: Gross hematuria and microscopic hematuria. 2 months ago lasted for 1.5 days. History of lupus. Right flank and right lower quadrant pain. History renal stones. Smoker.  CT ABDOMEN AND PELVIS WITHOUT AND  WITH CONTRAST  Technique: Multidetector CT imaging of the abdomen and pelvis was performed without contrast material in one or both body regions, followed by contrast material(s) and further sections in one or both body regions.  Contrast: 125 ml Isovue 300  Comparison: 04/13/2008 [facility]).  Findings: Unenhanced images demonstrate renal vascular calcifications bilaterally. No renal calculi or hydronephrosis. Phleboliths in the pelvis. No hydroureter and no convincing evidence of ureteric stone.  Post contrast images demonstrate clear lung bases. Normal heart size without pericardial or pleural effusion. Heart size upper normal, without pericardial or pleural effusion.  A low density hepatic dome lesion on image 10 of series 3 is unchanged at 1.0 cm, likely a cyst or minimally complex cyst.  A splenule. Normal stomach, pancreas, gallbladder, biliary tract, adrenal glands.  No renal mass on corticomedullary phase imaging. Renal collecting systems moderately well opacified on delayed images. Despite 2 attempts, the left ureter is incompletely opacified on delayed images. Portions of the opacified ureters demonstrate no filling defect.  Atherosclerosis at the origin of the left renal artery. Mild non aneurysmal dilatation of the infrarenal abdominal aorta, at up to 2.4 cm. No surrounding hemorrhage. No retroperitoneal or retrocrural adenopathy. Calcified portacaval node on image 24 is likely post infectious or inflammatory.  Normal terminal ileum and appendix. Normal small bowel without abdominal ascites.   No pelvic adenopathy.  Normal urinary bladder. Hysterectomy. No adnexal mass. No bladder filling defect on well opacified delayed images. No significant free fluid. No acute osseous abnormality.  IMPRESSION:  1. No acute process or explanation for hematuria. Portions of the left ureter are incompletely opacified on delayed images. 2. Non aneurysmal  infrarenal abdominal aortic dilatation.     Test Name Result Flag Reference CREATININE 0.88 mg/dL  4.09-8.11 BUN 12 mg/dL  9-14     Assessment Assessed  1. 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 I therefore have discussed the need for resection of the tumors for treatment as well as staging purposes. We discussed the procedure of transurethral resection of the bladder tumors in detail today and I've gone over the risks and complications with her. I also have recommended postoperative mitomycin C. We discussed the probabilities of success with this procedure. Jennifer Ball understands and has elected to proceed.   Plan    Jennifer Ball will be scheduled for TURBT as an outpatient with anticipated postoperative mitomycin - C installation.

## 2011-12-24 ENCOUNTER — Encounter (HOSPITAL_BASED_OUTPATIENT_CLINIC_OR_DEPARTMENT_OTHER): Payer: Self-pay | Admitting: Anesthesiology

## 2011-12-24 ENCOUNTER — Encounter (HOSPITAL_BASED_OUTPATIENT_CLINIC_OR_DEPARTMENT_OTHER)
Admission: RE | Disposition: A | Discharge status: Home / Self Care | Payer: Self-pay | Source: Ambulatory Visit | Attending: Urology

## 2011-12-24 ENCOUNTER — Ambulatory Visit (HOSPITAL_BASED_OUTPATIENT_CLINIC_OR_DEPARTMENT_OTHER): Payer: PRIVATE HEALTH INSURANCE | Admitting: Anesthesiology

## 2011-12-24 ENCOUNTER — Ambulatory Visit (HOSPITAL_BASED_OUTPATIENT_CLINIC_OR_DEPARTMENT_OTHER)
Admission: RE | Admit: 2011-12-24 | Discharge: 2011-12-24 | Disposition: A | Discharge status: Home / Self Care | Payer: PRIVATE HEALTH INSURANCE | Source: Ambulatory Visit | Attending: Urology | Admitting: Urology

## 2011-12-24 ENCOUNTER — Encounter (HOSPITAL_BASED_OUTPATIENT_CLINIC_OR_DEPARTMENT_OTHER): Payer: Self-pay | Admitting: *Deleted

## 2011-12-24 DIAGNOSIS — Z79899 Other long term (current) drug therapy: Secondary | ICD-10-CM | POA: Insufficient documentation

## 2011-12-24 DIAGNOSIS — E119 Type 2 diabetes mellitus without complications: Secondary | ICD-10-CM | POA: Insufficient documentation

## 2011-12-24 DIAGNOSIS — I10 Essential (primary) hypertension: Secondary | ICD-10-CM | POA: Insufficient documentation

## 2011-12-24 DIAGNOSIS — M810 Age-related osteoporosis without current pathological fracture: Secondary | ICD-10-CM | POA: Insufficient documentation

## 2011-12-24 DIAGNOSIS — C679 Malignant neoplasm of bladder, unspecified: Secondary | ICD-10-CM

## 2011-12-24 DIAGNOSIS — E039 Hypothyroidism, unspecified: Secondary | ICD-10-CM | POA: Insufficient documentation

## 2011-12-24 DIAGNOSIS — J45909 Unspecified asthma, uncomplicated: Secondary | ICD-10-CM | POA: Insufficient documentation

## 2011-12-24 DIAGNOSIS — D090 Carcinoma in situ of bladder: Secondary | ICD-10-CM | POA: Insufficient documentation

## 2011-12-24 DIAGNOSIS — E785 Hyperlipidemia, unspecified: Secondary | ICD-10-CM | POA: Insufficient documentation

## 2011-12-24 DIAGNOSIS — Z7982 Long term (current) use of aspirin: Secondary | ICD-10-CM | POA: Insufficient documentation

## 2011-12-24 HISTORY — DX: Discoid lupus erythematosus: L93.0

## 2011-12-24 HISTORY — DX: Simple chronic bronchitis: J41.0

## 2011-12-24 HISTORY — DX: Rheumatoid arthritis, unspecified: M06.9

## 2011-12-24 HISTORY — DX: Shortness of breath: R06.02

## 2011-12-24 HISTORY — DX: Personal history of other diseases of the circulatory system: Z86.79

## 2011-12-24 HISTORY — PX: TRANSURETHRAL RESECTION OF BLADDER TUMOR: SHX2575

## 2011-12-24 HISTORY — DX: Neoplasm of unspecified behavior of bladder: D49.4

## 2011-12-24 HISTORY — DX: Cardiac murmur, unspecified: R01.1

## 2011-12-24 HISTORY — DX: Type 2 diabetes mellitus without complications: E11.9

## 2011-12-24 LAB — POCT I-STAT, CHEM 8
BUN: 9 mg/dL (ref 6–23)
Calcium, Ion: 1.22 mmol/L (ref 1.13–1.30)
Chloride: 105 mEq/L (ref 96–112)
Creatinine, Ser: 0.9 mg/dL (ref 0.50–1.10)
Glucose, Bld: 88 mg/dL (ref 70–99)
HCT: 39 % (ref 36.0–46.0)
Hemoglobin: 13.3 g/dL (ref 12.0–15.0)
Potassium: 3.6 mEq/L (ref 3.5–5.1)
Sodium: 142 mEq/L (ref 135–145)
TCO2: 25 mmol/L (ref 0–100)

## 2011-12-24 SURGERY — TURBT (TRANSURETHRAL RESECTION OF BLADDER TUMOR)
Anesthesia: General | Site: Bladder | Wound class: Clean Contaminated

## 2011-12-24 MED ORDER — CIPROFLOXACIN IN D5W 200 MG/100ML IV SOLN
200.0000 mg | INTRAVENOUS | Status: AC
  Administered 2011-12-24: 200 mg via INTRAVENOUS

## 2011-12-24 MED ORDER — ONDANSETRON HCL 4 MG/2ML IJ SOLN
INTRAMUSCULAR | Status: DC | PRN
  Administered 2011-12-24: 4 mg via INTRAVENOUS

## 2011-12-24 MED ORDER — PROPOFOL 10 MG/ML IV BOLUS
INTRAVENOUS | Status: DC | PRN
  Administered 2011-12-24: 250 mg via INTRAVENOUS
  Administered 2011-12-24: 100 mg via INTRAVENOUS

## 2011-12-24 MED ORDER — SODIUM CHLORIDE 0.9 % IR SOLN
Status: DC | PRN
  Administered 2011-12-24: 12000 mL

## 2011-12-24 MED ORDER — ROCURONIUM BROMIDE 100 MG/10ML IV SOLN
INTRAVENOUS | Status: DC | PRN
  Administered 2011-12-24: 10 mg via INTRAVENOUS
  Administered 2011-12-24: 20 mg via INTRAVENOUS

## 2011-12-24 MED ORDER — LACTATED RINGERS IV SOLN: INTRAVENOUS | Status: DC | PRN

## 2011-12-24 MED ORDER — TRAMADOL HCL 50 MG PO TABS: 50.0000 mg | ORAL_TABLET | Freq: Four times a day (QID) | ORAL | Status: DC | PRN

## 2011-12-24 MED ORDER — LACTATED RINGERS IV SOLN
INTRAVENOUS | Status: DC
  Administered 2011-12-24: 08:00:00 via INTRAVENOUS

## 2011-12-24 MED ORDER — LACTATED RINGERS IV SOLN
INTRAVENOUS | Status: DC | PRN
  Administered 2011-12-24 (×2): via INTRAVENOUS

## 2011-12-24 MED ORDER — GLYCOPYRROLATE 0.2 MG/ML IJ SOLN
INTRAMUSCULAR | Status: DC | PRN
  Administered 2011-12-24: 0.4 mg via INTRAVENOUS
  Administered 2011-12-24: 0.2 mg via INTRAVENOUS

## 2011-12-24 MED ORDER — KETOROLAC TROMETHAMINE 30 MG/ML IJ SOLN
INTRAMUSCULAR | Status: DC | PRN
  Administered 2011-12-24: 30 mg via INTRAVENOUS

## 2011-12-24 MED ORDER — LIDOCAINE HCL (CARDIAC) 20 MG/ML IV SOLN
INTRAVENOUS | Status: DC | PRN
  Administered 2011-12-24: 75 mg via INTRAVENOUS

## 2011-12-24 MED ORDER — MITOMYCIN CHEMO FOR BLADDER INSTILLATION 40 MG
40.0000 mg | Freq: Once | INTRAVENOUS | Status: AC
  Administered 2011-12-24: 40 mg via INTRAVESICAL
  Filled 2011-12-24: qty 40

## 2011-12-24 MED ORDER — TOLTERODINE TARTRATE ER 4 MG PO CP24: 4.0000 mg | ORAL_CAPSULE | Freq: Every day | ORAL | Status: DC

## 2011-12-24 MED ORDER — METOCLOPRAMIDE HCL 5 MG/ML IJ SOLN
INTRAMUSCULAR | Status: DC | PRN
  Administered 2011-12-24: 10 mg via INTRAVENOUS

## 2011-12-24 MED ORDER — SUCCINYLCHOLINE CHLORIDE 20 MG/ML IJ SOLN
INTRAMUSCULAR | Status: DC | PRN
  Administered 2011-12-24: 100 mg via INTRAVENOUS

## 2011-12-24 MED ORDER — PHENAZOPYRIDINE HCL 200 MG PO TABS: 200.0000 mg | ORAL_TABLET | Freq: Three times a day (TID) | ORAL | Status: DC | PRN

## 2011-12-24 MED ORDER — NEOSTIGMINE METHYLSULFATE 1 MG/ML IJ SOLN
INTRAMUSCULAR | Status: DC | PRN
  Administered 2011-12-24: 4 mg via INTRAVENOUS

## 2011-12-24 MED ORDER — FENTANYL CITRATE 0.05 MG/ML IJ SOLN
INTRAMUSCULAR | Status: DC | PRN
  Administered 2011-12-24 (×2): 50 ug via INTRAVENOUS

## 2011-12-24 SURGICAL SUPPLY — 32 items
BAG DRAIN URO-CYSTO SKYTR STRL (DRAIN) ×2 IMPLANT
BAG DRN ANRFLXCHMBR STRAP LEK (BAG)
BAG DRN UROCATH (DRAIN) ×1
BAG URINE DRAINAGE (UROLOGICAL SUPPLIES) ×1 IMPLANT
BAG URINE LEG 19OZ MD ST LTX (BAG) IMPLANT
CANISTER SUCT LVC 12 LTR MEDI- (MISCELLANEOUS) ×2 IMPLANT
CATH FOLEY 2WAY SLVR  5CC 20FR (CATHETERS) ×1
CATH FOLEY 2WAY SLVR  5CC 22FR (CATHETERS)
CATH FOLEY 2WAY SLVR  5CC 24FR (CATHETERS) ×1
CATH FOLEY 2WAY SLVR 5CC 20FR (CATHETERS) IMPLANT
CATH FOLEY 2WAY SLVR 5CC 22FR (CATHETERS) IMPLANT
CATH FOLEY 2WAY SLVR 5CC 24FR (CATHETERS) ×1 IMPLANT
CLOTH BEACON ORANGE TIMEOUT ST (SAFETY) ×2 IMPLANT
DRAPE CAMERA CLOSED 9X96 (DRAPES) ×2 IMPLANT
ELECT BUTTON HF 24-28F 2 30DE (ELECTRODE) IMPLANT
ELECT LOOP MED HF 24F 12D (CUTTING LOOP) IMPLANT
ELECT LOOP MED HF 24F 12D CBL (CLIP) ×2 IMPLANT
ELECT REM PT RETURN 9FT ADLT (ELECTROSURGICAL) ×2
ELECT RESECT VAPORIZE 12D CBL (ELECTRODE) IMPLANT
ELECTRODE REM PT RTRN 9FT ADLT (ELECTROSURGICAL) ×1 IMPLANT
EVACUATOR MICROVAS BLADDER (UROLOGICAL SUPPLIES) ×1 IMPLANT
GLOVE BIO SURGEON STRL SZ8 (GLOVE) ×2 IMPLANT
GLOVE INDICATOR 7.0 STRL GRN (GLOVE) ×2 IMPLANT
GOWN PREVENTION PLUS LG XLONG (DISPOSABLE) ×2 IMPLANT
GOWN STRL REIN XL XLG (GOWN DISPOSABLE) ×2 IMPLANT
HOLDER FOLEY CATH W/STRAP (MISCELLANEOUS) ×1 IMPLANT
IV NS IRRIG 3000ML ARTHROMATIC (IV SOLUTION) ×4 IMPLANT
KIT ASPIRATION TUBING (SET/KITS/TRAYS/PACK) ×1 IMPLANT
PACK CYSTOSCOPY (CUSTOM PROCEDURE TRAY) ×2 IMPLANT
PLUG CATH AND CAP STER (CATHETERS) ×1 IMPLANT
SET ASPIRATION TUBING (TUBING) IMPLANT
WATER STERILE IRR 3000ML UROMA (IV SOLUTION) IMPLANT

## 2011-12-24 NOTE — Anesthesia Preprocedure Evaluation (Addendum)
Anesthesia Evaluation  Patient identified by MRN, date of birth, ID band Patient awake    Reviewed: Allergy & Precautions, H&P , NPO status , Patient's Chart, lab work & pertinent test results  Airway Mallampati: II TM Distance: >3 FB Neck ROM: Full    Dental  (+) Dental Advisory Given, Edentulous Upper and Edentulous Lower   Pulmonary shortness of breath and with exertion, asthma , COPDformer smoker,  breath sounds clear to auscultation  Pulmonary exam normal       Cardiovascular hypertension, Pt. on medications + Valvular Problems/Murmurs Rhythm:Regular Rate:Normal     Neuro/Psych PSYCHIATRIC DISORDERS  Neuromuscular disease    GI/Hepatic negative GI ROS,   Endo/Other  diabetes, Type 2Hypothyroidism   Renal/GU      Musculoskeletal  (+) Arthritis -, Osteoarthritis,    Abdominal   Peds  Hematology   Anesthesia Other Findings   Reproductive/Obstetrics                         Anesthesia Physical Anesthesia Plan  ASA: III  Anesthesia Plan: General   Post-op Pain Management:    Induction: Intravenous  Airway Management Planned: LMA  Additional Equipment:   Intra-op Plan:   Post-operative Plan: Extubation in OR  Informed Consent: I have reviewed the patients History and Physical, chart, labs and discussed the procedure including the risks, benefits and alternatives for the proposed anesthesia with the patient or authorized representative who has indicated his/her understanding and acceptance.   Dental advisory given  Plan Discussed with: CRNA  Anesthesia Plan Comments:         Anesthesia Quick Evaluation

## 2011-12-24 NOTE — Anesthesia Procedure Notes (Addendum)
Performed by: Jessica Priest   Procedure Name: Intubation Date/Time: 12/24/2011 8:46 AM Performed by: Jessica Priest Pre-anesthesia Checklist: Patient identified, Emergency Drugs available, Suction available and Patient being monitored Patient Re-evaluated:Patient Re-evaluated prior to inductionOxygen Delivery Method: Circle System Utilized Preoxygenation: Pre-oxygenation with 100% oxygen Intubation Type: IV induction Ventilation: Mask ventilation without difficulty Grade View: Grade II Tube type: Oral Tube size: 7.0 mm Number of attempts: 1 Airway Equipment and Method: stylet and oral airway Placement Confirmation: ETT inserted through vocal cords under direct vision,  positive ETCO2 and breath sounds checked- equal and bilateral Tube secured with: Tape Dental Injury: Teeth and Oropharynx as per pre-operative assessment

## 2011-12-24 NOTE — Interval H&P Note (Signed)
History and Physical Interval Note:  12/24/2011 8:49 AM  Stephannie Li  has presented today for surgery, with the diagnosis of Bladder Tumors  The various methods of treatment have been discussed with the patient and family. After consideration of risks, benefits and other options for treatment, the patient has consented to  Procedure(s) (LRB) with comments: TRANSURETHRAL RESECTION OF BLADDER TUMOR (TURBT) (N/A) - 1 hour requested for this case  [facility]ERA as a surgical intervention .  The patient's history has been reviewed, patient examined, no change in status, stable for surgery.  I have reviewed the patient's chart and labs.  Questions were answered to the patient's satisfaction.     Garnett Farm

## 2011-12-24 NOTE — Anesthesia Postprocedure Evaluation (Signed)
Anesthesia Post Note  Patient: Jennifer Ball  Procedure(s) Performed: Procedure(s) (LRB): TRANSURETHRAL RESECTION OF BLADDER TUMOR (TURBT) (N/A)  Anesthesia type: General  Patient location: PACU  Post pain: Pain level controlled  Post assessment: Post-op Vital signs reviewed  Last Vitals: BP 122/70  Pulse 64  Temp 36.2 C (Oral)  Resp 12  Ht 5\' 7"  (1.702 m)  Wt 185 lb (83.915 kg)  BMI 28.97 kg/m2  SpO2 91%  Post vital signs: Reviewed  Level of consciousness: sedated  Complications: No apparent anesthesia complications

## 2011-12-24 NOTE — Transfer of Care (Signed)
Immediate Anesthesia Transfer of Care Note  Patient: Jennifer Ball  Procedure(s) Performed: Procedure(s) (LRB): TRANSURETHRAL RESECTION OF BLADDER TUMOR (TURBT) (N/A)  Patient Location: PACU  Anesthesia Type: General  Level of Consciousness: awake, sedated, patient cooperative and responds to stimulation  Airway & Oxygen Therapy: Patient Spontanous Breathing and Patient connected to face mask oxygen  Post-op Assessment: Report given to PACU RN, Post -op Vital signs reviewed and stable and Patient moving all extremities  Post vital signs: Reviewed and stable  Complications: No apparent anesthesia complications

## 2011-12-24 NOTE — Op Note (Signed)
PATIENT:  Jennifer Ball  PRE-OPERATIVE DIAGNOSIS: Bladder tumor  POST-OPERATIVE DIAGNOSIS: Same  PROCEDURE:  Procedure(s): 1.TRANSURETHRAL RESECTION OF BLADDER TUMOR (TURBT) (4.5cm.) 2. Instillation of intravesical chemotherapy (mitomycin-C)  SURGEON:  Surgeon(s): Garnett Farm  ANESTHESIA:   General  EBL:  Minimal  DRAINS: Urinary Catheter (20 Fr. Foley)   SPECIMEN:  Source of Specimen:  Bladder tumor  DISPOSITION OF SPECIMEN:  PATHOLOGY  Indication: Jennifer Ball is a 71 year old female who experienced gross hematuria and has an extensive cigarette smoking history. She was found to have no abnormality of the upper tract by CT scan and cystoscopy revealed multiple bladder tumors involving the posterior right wall of the bladder. She is brought to the operating room for resection of her bladder tumors.  Description of operation: The patient was taken to the operating room and administered general anesthesia. He was then placed on the table and moved to the dorsal lithotomy position after which his genitalia was sterilely prepped and draped. An official timeout was then performed.  The urethra was noted not except the resectoscope initially so I dilated with female sounds from 24-28 [place]. The 26 French resectoscope with Timberlake obturator was then introduced into the bladder and the obturator was removed. The resectoscope element with 12 lens was then inserted and the bladder was fully and systematically inspected. Ureteral orifices were noted to be in the normal anatomic positions. She had several papillary tumors involving the posterior right wall of the bladder as well as areas that appeared erythematous and quite worrisome for CIS. There was fairly extensive involvement of the posterior right wall but no other areas of papillary tumors or erythematous regions were identified and the remainder of the bladder.  I first began by resecting the obvious papillary tumors from the  posterior wall of the bladder. The fact that the lesions were on the posterior wall made it somewhat challenging to resect the lesions without going too deep into the wall of the bladder that appeared fairly thin so I maintained the bladder and completely filled and was able to essentially shaved the tumors off of the bladder wall. I was able to obtain a small amount of specimen but most of the specimen was obliterated as I resected. Reinspection of the bladder revealed all obvious tumor had been fully resected and there was no evidence of perforation. The Microvasive evacuator was then used to irrigate the bladder and remove all of the portions of bladder tumor which were sent to pathology. I then removed the resectoscope.  A 20 French Foley catheter was then inserted in the bladder and irrigated. The irrigant returned slightly pink with no clots. The patient was awakened and taken to the recovery room.  While in the recovery room 40 mg of mitomycin-C in 40 cc of water were instilled in the bladder through the catheter and the catheter was plugged. This will remain indwelling for approximately one hour. It will then be drained from the bladder and the catheter will be removed and the patient discharged home.  PLAN OF CARE: Discharge to home after PACU  PATIENT DISPOSITION:  PACU - hemodynamically stable.

## 2011-12-25 ENCOUNTER — Encounter (HOSPITAL_BASED_OUTPATIENT_CLINIC_OR_DEPARTMENT_OTHER): Payer: Self-pay | Admitting: Urology

## 2012-02-18 ENCOUNTER — Other Ambulatory Visit: Payer: Self-pay | Admitting: Endocrinology

## 2012-03-17 ENCOUNTER — Ambulatory Visit (INDEPENDENT_AMBULATORY_CARE_PROVIDER_SITE_OTHER): Payer: PRIVATE HEALTH INSURANCE

## 2012-03-17 ENCOUNTER — Ambulatory Visit (INDEPENDENT_AMBULATORY_CARE_PROVIDER_SITE_OTHER): Payer: PRIVATE HEALTH INSURANCE | Admitting: Internal Medicine

## 2012-03-17 ENCOUNTER — Encounter: Payer: Self-pay | Admitting: Internal Medicine

## 2012-03-17 VITALS — BP 132/80 | HR 94 | Temp 99.4°F | Ht 66.0 in | Wt 186.1 lb

## 2012-03-17 DIAGNOSIS — E119 Type 2 diabetes mellitus without complications: Secondary | ICD-10-CM

## 2012-03-17 DIAGNOSIS — Z0000 Encounter for general adult medical examination without abnormal findings: Secondary | ICD-10-CM

## 2012-03-17 DIAGNOSIS — C679 Malignant neoplasm of bladder, unspecified: Secondary | ICD-10-CM | POA: Insufficient documentation

## 2012-03-17 DIAGNOSIS — M7071 Other bursitis of hip, right hip: Secondary | ICD-10-CM | POA: Insufficient documentation

## 2012-03-17 DIAGNOSIS — M76899 Other specified enthesopathies of unspecified lower limb, excluding foot: Secondary | ICD-10-CM

## 2012-03-17 HISTORY — DX: Malignant neoplasm of bladder, unspecified: C67.9

## 2012-03-17 LAB — CBC WITH DIFFERENTIAL/PLATELET
Basophils Relative: 1.4 % (ref 0.0–3.0)
Eosinophils Relative: 0.6 % (ref 0.0–5.0)
Lymphocytes Relative: 23 % (ref 12.0–46.0)
Monocytes Relative: 9 % (ref 3.0–12.0)
Neutrophils Relative %: 66 % (ref 43.0–77.0)
RBC: 4.07 Mil/uL (ref 3.87–5.11)
WBC: 4.6 10*3/uL (ref 4.5–10.5)

## 2012-03-17 LAB — BASIC METABOLIC PANEL
BUN: 9 mg/dL (ref 6–23)
CO2: 26 mEq/L (ref 19–32)
Calcium: 9 mg/dL (ref 8.4–10.5)
Chloride: 102 mEq/L (ref 96–112)
Creatinine, Ser: 0.8 mg/dL (ref 0.4–1.2)

## 2012-03-17 LAB — LIPID PANEL
Cholesterol: 127 mg/dL (ref 0–200)
HDL: 53 mg/dL (ref >39.00)
LDL Cholesterol: 61 mg/dL (ref 0–99)
Triglycerides: 67 mg/dL (ref 0.0–149.0)

## 2012-03-17 LAB — HEPATIC FUNCTION PANEL
ALT: 23 U/L (ref 0–35)
Total Bilirubin: 0.7 mg/dL (ref 0.3–1.2)
Total Protein: 7.6 g/dL (ref 6.0–8.3)

## 2012-03-17 LAB — HEMOGLOBIN A1C: Hgb A1c MFr Bld: 6.1 % (ref 4.6–6.5)

## 2012-03-17 LAB — URINALYSIS, ROUTINE W REFLEX MICROSCOPIC
Nitrite: NEGATIVE
Specific Gravity, Urine: 1.01 (ref 1.000–1.030)
Total Protein, Urine: NEGATIVE
pH: 7 (ref 5.0–8.0)

## 2012-03-17 LAB — MICROALBUMIN / CREATININE URINE RATIO: Microalb Creat Ratio: 2.5 mg/g (ref 0.0–30.0)

## 2012-03-17 MED ORDER — TRIAMCINOLONE ACETONIDE 0.1 % EX CREA: TOPICAL_CREAM | Freq: Two times a day (BID) | CUTANEOUS | Status: DC

## 2012-03-17 MED ORDER — MELOXICAM 15 MG PO TABS: 15.0000 mg | ORAL_TABLET | Freq: Every day | ORAL | Status: DC

## 2012-03-17 NOTE — Assessment & Plan Note (Signed)
stable overall by hx and exam, most recent data reviewed with pt, and pt to continue medical treatment as before Lab Results  Component Value Date   HGBA1C 6.1 03/17/2012

## 2012-03-17 NOTE — Patient Instructions (Addendum)
Take all new medications as prescribed Continue all other medications as before Please have the pharmacy call with any other refills you may need. Please go to LAB in the Basement for the blood and/or urine tests to be done today You will be contacted by phone if any changes need to be made immediately.  Otherwise, you will receive a letter about your results with an explanation, but please check with MyChart first. You are otherwise up to date with prevention measures today Thank you for enrolling in MyChart. Please follow the instructions below to securely access your online medical record. MyChart allows you to send messages to your doctor, view your test results, renew your prescriptions, schedule appointments, and more. To Log into MyChart, please go to [email or web address], and your Username is: [email or web address] (pass  [id number]) Please return in 6 mo with Lab testing done 3-5 days before

## 2012-03-17 NOTE — Assessment & Plan Note (Signed)

## 2012-03-17 NOTE — Assessment & Plan Note (Signed)
Mild, for mobic prn,  to f/u any worsening symptoms or concerns  

## 2012-03-17 NOTE — Progress Notes (Signed)
Subjective:    Patient ID: Jennifer Ball, female    DOB: May 20, 1940, 72 y.o.   MRN: [id number]  HPI Here for wellness and f/u;  Overall doing ok;  Pt denies CP, worsening SOB, DOE, wheezing, orthopnea, PND, worsening LE edema, palpitations, dizziness or syncope.  Pt denies neurological change such as new Headache, facial or extremity weakness.  Pt denies polydipsia, polyuria, or low sugar symptoms. Pt states overall good compliance with treatment and medications, good tolerability, and trying to follow lower cholesterol diet.  Pt denies worsening depressive symptoms, suicidal ideation or panic. No fever, wt loss, night sweats, loss of appetite, or other constitutional symptoms.  Pt states good ability with ADL's, low fall risk, home safety reviewed and adequate, no significant changes in hearing or vision, and occasionally active with exercise. Saw Dr Kellie Simmering, with improved pain to right lateral hip after steroid shot, but now recurred.   Past Medical History  Diagnosis Date  . HYPOTHYROIDISM, POST-RADIATION 04/06/2008  . HYPERLIPIDEMIA 12/30/2006  . HYPERTENSION 12/30/2006  . COLONIC POLYPS, HX OF 12/30/2006  . OSTEOPOROSIS 12/30/2006  . History of nephrolithiasis 08/31/2011  . 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 Bladder tumor   . Heart murmur   . History of rheumatic fever CHILD  . Short of breath on exertion   . Rheumatoid arthritis FOLLOWED DR TRUESLOW  . Smokers' cough   . Diabetes mellitus type 2, diet-controlled   . Bladder cancer 03/17/2012   Past Surgical History  Procedure Date  . Total abdominal hysterectomy w/ bilateral salpingoophorectomy 1970's  . I-131 therapy 06/1999  . Nose surgery 1990    DISCOID LUPUS  . Transurethral resection of bladder tumor 12/24/2011    Procedure: TRANSURETHRAL RESECTION OF BLADDER TUMOR (TURBT);  Surgeon: Garnett Farm, MD;  Location: [facility];  Service: Urology;  Laterality: N/A;  1 hour requested for this  case  [facility]    reports that she has been smoking Cigarettes.  She has a 67.5 pack-year smoking history. She has never used smokeless tobacco. She reports that she does not drink alcohol or use illicit drugs. family history includes Cancer in her mother. No Known Allergies Current Outpatient Prescriptions on File Prior to Visit  Medication Sig Dispense Refill  . aspirin 325 MG tablet Take 325 mg by mouth 2 (two) times daily.       Marland Kitchen KLOR-CON M20 20 MEQ tablet take 1/2 tablet by mouth every MONDAY, WEDNESDAY AND FRIDAY  15 each  3  . levothyroxine (SYNTHROID, LEVOTHROID) 75 MCG tablet       . phenazopyridine (PYRIDIUM) 200 MG tablet Take 1 tablet (200 mg total) by mouth 3 (three) times daily as needed for pain.  30 tablet  0  . potassium chloride (K-DUR) 10 MEQ tablet Take 10 mEq by mouth daily. TAKES ONLY M/W/ F      . rosuvastatin (CRESTOR) 40 MG tablet       . tolterodine (DETROL LA) 4 MG 24 hr capsule Take 1 capsule (4 mg total) by mouth daily.  30 capsule  0  . traMADol (ULTRAM) 50 MG tablet Take 1 tablet (50 mg total) by mouth every 6 (six) hours as needed for pain.  30 tablet  0  . triamterene-hydrochlorothiazide (MAXZIDE-25) 37.5-25 MG per tablet Take 0.05 tablets by mouth every morning.       Current Facility-Administered Medications on File Prior to Visit  Medication Dose Route Frequency Provider Last Rate Last Dose  .  mitomycin (MUTAMYCIN) chemo injection 40 mg  40 mg Bladder Instillation Once Garnett Farm, MD       Did have bladder Ca diagnosed recetnly  Review of Systems Review of Systems  Constitutional: Negative for diaphoresis, activity change, appetite change and unexpected weight change.  HENT: Negative for hearing loss, ear pain, facial swelling, mouth sores and neck stiffness.   Eyes: Negative for pain, redness and visual disturbance.  Respiratory: Negative for shortness of breath and wheezing.   Cardiovascular: Negative for chest pain and palpitations.    Gastrointestinal: Negative for diarrhea, blood in stool, abdominal distention and rectal pain.  Genitourinary: Negative for hematuria, flank pain and decreased urine volume.  Musculoskeletal: Negative for myalgias and joint swelling.  Skin: Negative for color change and wound. except for itchy area to left medial distal leg after some kind of ? Insect bite or other 4 wks ago Neurological: Negative for syncope and numbness.  Hematological: Negative for adenopathy.  Psychiatric/Behavioral: Negative for hallucinations, self-injury, decreased concentration and agitation.      Objective:   Physical Exam BP 132/80  Pulse 94  Temp 99.4 F (37.4 C) (Oral)  Ht 5\' 6"  (1.676 m)  Wt 186 lb 2 oz (84.426 kg)  BMI 30.04 kg/m2  SpO2 94% Physical Exam  VS noted Constitutional: Pt is oriented to person, place, and time. Appears well-developed and well-nourished.  HENT:  Head: Normocephalic and atraumatic.  Right Ear: External ear normal.  Left Ear: External ear normal.  Nose: Nose normal.  Mouth/Throat: Oropharynx is clear and moist.  Eyes: Conjunctivae and EOM are normal. Pupils are equal, round, and reactive to light.  Neck: Normal range of motion. Neck supple. No JVD present. No tracheal deviation present.  Cardiovascular: Normal rate, regular rhythm, normal heart sounds and intact distal pulses.   Pulmonary/Chest: Effort normal and breath sounds normal.  Abdominal: Soft. Bowel sounds are normal. There is no tenderness.  Musculoskeletal: Normal range of motion. Exhibits no edema. Has mild tedner over right greater trochanter Lymphadenopathy:  Has no cervical adenopathy.  Neurological: Pt is alert and oriented to person, place, and time. Pt has normal reflexes. No cranial nerve deficit.  Skin: Skin is warm and dry. No rash noted. except for mild dark silvery area to left medial distal leg Psychiatric:  Has  normal mood and affect. Behavior is normal.  Has mild tender over right greater  trochanter    Assessment & Plan:

## 2012-03-20 ENCOUNTER — Other Ambulatory Visit: Payer: Self-pay | Admitting: Urology

## 2012-03-26 ENCOUNTER — Encounter (HOSPITAL_BASED_OUTPATIENT_CLINIC_OR_DEPARTMENT_OTHER): Payer: Self-pay | Admitting: *Deleted

## 2012-03-26 NOTE — Progress Notes (Signed)
Pt instructed npo p mn 1/19 x synthroid, crestor, potassium w sip of water.  To [facility] 1/20 @ 0615.  Needs istat on arrival

## 2012-03-31 ENCOUNTER — Ambulatory Visit (HOSPITAL_BASED_OUTPATIENT_CLINIC_OR_DEPARTMENT_OTHER)
Admission: RE | Admit: 2012-03-31 | Discharge: 2012-03-31 | Disposition: A | Discharge status: Home / Self Care | Payer: PRIVATE HEALTH INSURANCE | Source: Ambulatory Visit | Attending: Urology | Admitting: Urology

## 2012-03-31 ENCOUNTER — Encounter (HOSPITAL_BASED_OUTPATIENT_CLINIC_OR_DEPARTMENT_OTHER)
Admission: RE | Disposition: A | Discharge status: Home / Self Care | Payer: Self-pay | Source: Ambulatory Visit | Attending: Urology

## 2012-03-31 ENCOUNTER — Ambulatory Visit (HOSPITAL_BASED_OUTPATIENT_CLINIC_OR_DEPARTMENT_OTHER): Payer: PRIVATE HEALTH INSURANCE | Admitting: Anesthesiology

## 2012-03-31 ENCOUNTER — Encounter (HOSPITAL_BASED_OUTPATIENT_CLINIC_OR_DEPARTMENT_OTHER): Payer: Self-pay | Admitting: Anesthesiology

## 2012-03-31 ENCOUNTER — Encounter (HOSPITAL_BASED_OUTPATIENT_CLINIC_OR_DEPARTMENT_OTHER): Payer: Self-pay | Admitting: *Deleted

## 2012-03-31 DIAGNOSIS — Z79899 Other long term (current) drug therapy: Secondary | ICD-10-CM | POA: Insufficient documentation

## 2012-03-31 DIAGNOSIS — E039 Hypothyroidism, unspecified: Secondary | ICD-10-CM | POA: Insufficient documentation

## 2012-03-31 DIAGNOSIS — E785 Hyperlipidemia, unspecified: Secondary | ICD-10-CM | POA: Insufficient documentation

## 2012-03-31 DIAGNOSIS — C679 Malignant neoplasm of bladder, unspecified: Secondary | ICD-10-CM

## 2012-03-31 DIAGNOSIS — N3289 Other specified disorders of bladder: Secondary | ICD-10-CM | POA: Insufficient documentation

## 2012-03-31 DIAGNOSIS — E119 Type 2 diabetes mellitus without complications: Secondary | ICD-10-CM | POA: Insufficient documentation

## 2012-03-31 DIAGNOSIS — M810 Age-related osteoporosis without current pathological fracture: Secondary | ICD-10-CM | POA: Insufficient documentation

## 2012-03-31 DIAGNOSIS — D090 Carcinoma in situ of bladder: Secondary | ICD-10-CM | POA: Insufficient documentation

## 2012-03-31 DIAGNOSIS — I10 Essential (primary) hypertension: Secondary | ICD-10-CM | POA: Insufficient documentation

## 2012-03-31 DIAGNOSIS — Z87891 Personal history of nicotine dependence: Secondary | ICD-10-CM | POA: Insufficient documentation

## 2012-03-31 HISTORY — PX: CYSTOSCOPY WITH BIOPSY: SHX5122

## 2012-03-31 HISTORY — DX: Unspecified hearing loss, unspecified ear: H91.90

## 2012-03-31 HISTORY — DX: Personal history of other medical treatment: Z92.89

## 2012-03-31 LAB — POCT I-STAT 4, (NA,K, GLUC, HGB,HCT)
Glucose, Bld: 84 mg/dL (ref 70–99)
HCT: 40 % (ref 36.0–46.0)
Hemoglobin: 13.6 g/dL (ref 12.0–15.0)
Potassium: 3.4 mEq/L — ABNORMAL LOW (ref 3.5–5.1)
Sodium: 140 mEq/L (ref 135–145)

## 2012-03-31 LAB — GLUCOSE, CAPILLARY: Glucose-Capillary: 85 mg/dL (ref 70–99)

## 2012-03-31 SURGERY — CYSTOSCOPY, WITH BIOPSY
Anesthesia: General | Site: Bladder | Wound class: Clean Contaminated

## 2012-03-31 MED ORDER — PHENAZOPYRIDINE HCL 200 MG PO TABS: 200.0000 mg | ORAL_TABLET | Freq: Three times a day (TID) | ORAL | Status: DC | PRN

## 2012-03-31 MED ORDER — ONDANSETRON HCL 4 MG/2ML IJ SOLN
INTRAMUSCULAR | Status: DC | PRN
  Administered 2012-03-31: 4 mg via INTRAVENOUS

## 2012-03-31 MED ORDER — PHENAZOPYRIDINE HCL 200 MG PO TABS
200.0000 mg | ORAL_TABLET | Freq: Once | ORAL | Status: DC
  Filled 2012-03-31: qty 1

## 2012-03-31 MED ORDER — PROPOFOL 10 MG/ML IV BOLUS
INTRAVENOUS | Status: DC | PRN
  Administered 2012-03-31: 200 mg via INTRAVENOUS

## 2012-03-31 MED ORDER — LIDOCAINE HCL (CARDIAC) 20 MG/ML IV SOLN
INTRAVENOUS | Status: DC | PRN
  Administered 2012-03-31: 80 mg via INTRAVENOUS

## 2012-03-31 MED ORDER — FENTANYL CITRATE 0.05 MG/ML IJ SOLN
INTRAMUSCULAR | Status: DC | PRN
  Administered 2012-03-31: 50 ug via INTRAVENOUS
  Administered 2012-03-31 (×2): 25 ug via INTRAVENOUS

## 2012-03-31 MED ORDER — CIPROFLOXACIN IN D5W 200 MG/100ML IV SOLN
200.0000 mg | INTRAVENOUS | Status: AC
  Administered 2012-03-31: 200 mg via INTRAVENOUS
  Filled 2012-03-31: qty 100

## 2012-03-31 MED ORDER — HYDROCODONE-ACETAMINOPHEN 7.5-325 MG PO TABS: 1.0000 | ORAL_TABLET | ORAL | Status: DC | PRN

## 2012-03-31 MED ORDER — OXYBUTYNIN CHLORIDE 5 MG PO TABS
5.0000 mg | ORAL_TABLET | Freq: Once | ORAL | Status: DC
  Filled 2012-03-31: qty 1

## 2012-03-31 MED ORDER — PROMETHAZINE HCL 25 MG/ML IJ SOLN
6.2500 mg | INTRAMUSCULAR | Status: DC | PRN
  Filled 2012-03-31: qty 1

## 2012-03-31 MED ORDER — LACTATED RINGERS IV SOLN
INTRAVENOUS | Status: DC
  Administered 2012-03-31 (×2): via INTRAVENOUS
  Filled 2012-03-31: qty 1000

## 2012-03-31 MED ORDER — FENTANYL CITRATE 0.05 MG/ML IJ SOLN
25.0000 ug | INTRAMUSCULAR | Status: DC | PRN
  Filled 2012-03-31: qty 1

## 2012-03-31 MED ORDER — KETOROLAC TROMETHAMINE 30 MG/ML IJ SOLN
15.0000 mg | Freq: Once | INTRAMUSCULAR | Status: DC | PRN
  Filled 2012-03-31: qty 1

## 2012-03-31 MED ORDER — EPHEDRINE SULFATE 50 MG/ML IJ SOLN
INTRAMUSCULAR | Status: DC | PRN
  Administered 2012-03-31: 15 mg via INTRAVENOUS

## 2012-03-31 SURGICAL SUPPLY — 16 items
BAG DRAIN URO-CYSTO SKYTR STRL (DRAIN) ×2 IMPLANT
BAG DRN UROCATH (DRAIN) ×1
CANISTER SUCT LVC 12 LTR MEDI- (MISCELLANEOUS) IMPLANT
CLOTH BEACON ORANGE TIMEOUT ST (SAFETY) ×2 IMPLANT
DRAPE CAMERA CLOSED 9X96 (DRAPES) ×2 IMPLANT
ELECT REM PT RETURN 9FT ADLT (ELECTROSURGICAL) ×2
ELECTRODE REM PT RTRN 9FT ADLT (ELECTROSURGICAL) ×1 IMPLANT
GLOVE BIO SURGEON STRL SZ8 (GLOVE) ×2 IMPLANT
GOWN PREVENTION PLUS LG XLONG (DISPOSABLE) ×2 IMPLANT
GOWN STRL NON-REIN LRG LVL3 (GOWN DISPOSABLE) ×1 IMPLANT
GOWN STRL REIN XL XLG (GOWN DISPOSABLE) ×3 IMPLANT
IV NS IRRIG 3000ML ARTHROMATIC (IV SOLUTION) IMPLANT
NEEDLE HYPO 22GX1.5 SAFETY (NEEDLE) IMPLANT
NS IRRIG 500ML POUR BTL (IV SOLUTION) ×1 IMPLANT
PACK CYSTOSCOPY (CUSTOM PROCEDURE TRAY) ×2 IMPLANT
WATER STERILE IRR 3000ML UROMA (IV SOLUTION) ×2 IMPLANT

## 2012-03-31 NOTE — Anesthesia Postprocedure Evaluation (Signed)
  Anesthesia Post-op Note  Patient: Jennifer Ball  Procedure(s) Performed: Procedure(s) (LRB): CYSTOSCOPY WITH BIOPSY (N/A)  Patient Location: PACU  Anesthesia Type: General  Level of Consciousness: awake and alert   Airway and Oxygen Therapy: Patient Spontanous Breathing  Post-op Pain: mild  Post-op Assessment: Post-op Vital signs reviewed, Patient's Cardiovascular Status Stable, Respiratory Function Stable, Patent Airway and No signs of Nausea or vomiting  Last Vitals:  Filed Vitals:   03/31/12 0755  BP: 110/59  Pulse: 79  Temp: 37.1 C  Resp: 10    Post-op Vital Signs: stable   Complications: No apparent anesthesia complications

## 2012-03-31 NOTE — H&P (Signed)
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   History of Present Illness  Transitional cell carcinoma of the bladder:  She underwent a contrasted CT scan in 2/10 that revealed no renal masses, hydronephrosis or stones. An MRI scan was done in 7/13 but also revealed no renal masses or hydronephrosis. She then experienced gross hematuria and in 9/13 underwent CT scan which revealed no abnormality of the upper tract. Cystoscopy revealed multiple tumors involving the posterior wall of the bladder on the right-hand side. She has a 52-pack-year smoking history as a risk factor for urothelial carcinoma. She underwent TURBT on 12/24/11. Pathology: CIS Tx: BCG x6 wks.   Interval history: No voiding complaints are noted today.   Past Medical History Problems  1. History of  Asthma 493.90 2. History of  Diabetes Mellitus 250.00 3. History of  Hyperlipidemia 272.4 4. History of  Hypertension 401.9 5. History of  Hypothyroidism 244.9 6. History of  Osteoporosis 733.00 7. History of  Reported A Previous Heart Murmur  Surgical History Problems  1. History of  Bladder Injection Of Cancer Treatment 2. History of  Cystoscopy With Fulguration Medium Lesion (2-5cm) 3. History of  Salpingo-oophorectomy Bilateral 4. History of  Total Abdominal Hysterectomy V45.77  Current Meds 1. Crestor 40 MG Oral Tablet; Therapy: 04Feb2013 to 2. Klor-Con M20 20 MEQ Oral Tablet Extended Release; Therapy: 04Feb2013 to 3. Levothyroxine Sodium 75 MCG Oral Tablet; Therapy: 05Apr2013 to 4. Maxzide-25 37.5-25 MG Oral Tablet; Therapy: (Recorded:06Aug2013) to 5. TraMADol HCl 50 MG Oral Tablet; Therapy: 14Oct2013 to  Allergies Medication  1. No Known Drug Allergies  Family History Problems  1. Family history of  No Significant Family History  Social History Problems  1. Former Smoker V15.82 11/2ppdx35 years 2. Marital History - Single Denied  3. History of  Alcohol Use 4.  History of  Caffeine Use  Review of Systems Genitourinary, constitutional, skin, eye, otolaryngeal, hematologic/lymphatic, cardiovascular, pulmonary, endocrine, musculoskeletal, gastrointestinal, neurological and psychiatric system(s) were reviewed and pertinent findings if present are noted.  Genitourinary: urinary frequency, nocturia and hematuria.  Gastrointestinal: constipation.  Constitutional: night sweats.  Integumentary: skin rash/lesion.  Hematologic/Lymphatic: a tendency to easily bruise.  Musculoskeletal: back pain and joint pain.    Vitals Vital Signs  BMI Calculated: 30.13 BSA Calculated: 1.99 Height: 5 ft 7 in Weight: 192 lb  Blood Pressure: 129 / 76 Heart Rate: 82  Physical Exam Constitutional: Well nourished and well developed . No acute distress.  ENT:. The ears and nose are normal in appearance.  Neck: The appearance of the neck is normal and no neck mass is present.  Pulmonary: No respiratory distress and normal respiratory rhythm and effort.  Cardiovascular: Heart rate and rhythm are normal . No peripheral edema.  Abdomen: The abdomen is soft and nontender. No masses are palpated. No CVA tenderness. No hernias are palpable. No hepatosplenomegaly noted.  Lymphatics: The femoral and inguinal nodes are not enlarged or tender.  Skin: Normal skin turgor, no visible rash and no visible skin lesions.  Neuro/Psych:. Mood and affect are appropriate.   Assessment Assessed  1. Visit For: Exam Following High-risk Medication V67.51 2. Transitional Cell Carcinoma Of The Bladder 188.9   She has done well with her induction course of BCG. Due to the presence of CIS on her initial pathology I have discussed the need to reevaluate the bladder under anesthesia with cystoscopy and barbotage. In addition I sent urine for cytology.  We went over the rationale behind this procedure, it the risks, complications and alternatives as well as the probability of success. She understands and  has elected to proceed.   Plan    1. Urine was sent for cytology and found to be negative. 2. She'll be scheduled for repeat bladder biopsy and barbotage.

## 2012-03-31 NOTE — Anesthesia Preprocedure Evaluation (Signed)
Anesthesia Evaluation  Patient identified by MRN, date of birth, ID band Patient awake    Reviewed: Allergy & Precautions, H&P , NPO status , Patient's Chart, lab work & pertinent test results  Airway Mallampati: II TM Distance: <3 FB Neck ROM: Full    Dental No notable dental hx.    Pulmonary asthma , Current Smoker,  breath sounds clear to auscultation  Pulmonary exam normal       Cardiovascular hypertension, Pt. on medications + Peripheral Vascular Disease Rhythm:Regular Rate:Normal     Neuro/Psych negative neurological ROS  negative psych ROS   GI/Hepatic negative GI ROS, Neg liver ROS,   Endo/Other  diabetes  Renal/GU negative Renal ROS  negative genitourinary   Musculoskeletal negative musculoskeletal ROS (+)   Abdominal   Peds negative pediatric ROS (+)  Hematology negative hematology ROS (+)   Anesthesia Other Findings   Reproductive/Obstetrics negative OB ROS                           Anesthesia Physical Anesthesia Plan  ASA: III  Anesthesia Plan: General   Post-op Pain Management:    Induction: Intravenous  Airway Management Planned: LMA  Additional Equipment:   Intra-op Plan:   Post-operative Plan:   Informed Consent: I have reviewed the patients History and Physical, chart, labs and discussed the procedure including the risks, benefits and alternatives for the proposed anesthesia with the patient or authorized representative who has indicated his/her understanding and acceptance.   Dental advisory given  Plan Discussed with: CRNA and Surgeon  Anesthesia Plan Comments:         Anesthesia Quick Evaluation

## 2012-03-31 NOTE — Op Note (Signed)
PATIENT:  Jennifer Ball  PRE-OPERATIVE DIAGNOSIS:  History of CIS  POST-OPERATIVE DIAGNOSIS:  Same  PROCEDURE:  Procedure(s): 1. Cystoscopy 2. Bladder barbotage 3. Cold cup biopsies of the bladder  SURGEON:  Garnett Farm  INDICATION: Jennifer Ball is a 72 year old female who was found to have multiple bladder tumors involving the posterior wall the bladder on the right-hand side which were treated with TURBT in 10/13. She has a 52-pack-year smoking history and the pathology revealed CIS. She was treated with a six-week induction course of BCG and is brought back to the operating room for reevaluation.  ANESTHESIA:  General  EBL:  Minimal  DRAINS: None  LOCAL MEDICATIONS USED:  None  SPECIMEN: 1. Cold cup biopsies from the posterior right wall, posterior midline, left wall and right wall 2. Bladder barbotage for cytology  DISPOSITION OF SPECIMEN:  PATHOLOGY  Description of procedure: After informed consent the patient was taken to the major or, placed on the table and administered general anesthesia. She was then moved to the dorsal lithotomy position and her genitalia sterilely prepped and draped. An official timeout was then performed.  The 21 French rigid cystoscope with 12 lens was then passed into the bladder and the bladder was fully and systematically inspected. No papillary tumors were identified. Both ureteral orifices were noted to be of normal configuration and position with clear eflux. I noted all scars on the posterior wall the bladder as well as a area of mild erythema associated with a scar to the right of the midline. No other areas of erythema were identified within the bladder.  Using sterile saline I obtained a barbotaged specimen from the bladder and this was sent to pathology. I then inserted the cold cup biopsy forceps and obtained biopsies from the above noted locations. The area of erythema on the right posterior wall was specifically biopsied. I then used the  Bugbee electrode to fulgurate each of the biopsy sites. After this was performed I reinspected the bladder noted no evidence of perforation and no bleeding. I therefore drained the bladder and the patient was awakened and taken to the recovery room in stable and satisfactory condition. She tolerated procedure well no intraoperative complications.  PLAN OF CARE: Discharge to home after PACU  PATIENT DISPOSITION:  PACU - hemodynamically stable.

## 2012-03-31 NOTE — Anesthesia Procedure Notes (Signed)
Procedure Name: LMA Insertion Date/Time: 03/31/2012 7:26 AM Performed by: Fran Lowes Pre-anesthesia Checklist: Patient identified, Emergency Drugs available, Suction available and Patient being monitored Patient Re-evaluated:Patient Re-evaluated prior to inductionOxygen Delivery Method: Circle System Utilized Preoxygenation: Pre-oxygenation with 100% oxygen Intubation Type: IV induction Ventilation: Mask ventilation without difficulty LMA: LMA inserted LMA Size: 4.0 Number of attempts: 1 Airway Equipment and Method: bite block Placement Confirmation: positive ETCO2 Tube secured with: Tape Dental Injury: Teeth and Oropharynx as per pre-operative assessment

## 2012-03-31 NOTE — Transfer of Care (Signed)
Immediate Anesthesia Transfer of Care Note  Patient: Jennifer Ball  Procedure(s) Performed: Procedure(s) (LRB): CYSTOSCOPY WITH BIOPSY (N/A)  Patient Location: Patient transported to PACU with oxygen via face mask at 4 Liters / Min  Anesthesia Type: General  Level of Consciousness: awake and alert   Airway & Oxygen Therapy: Patient Spontanous Breathing and Patient connected to face mask oxygen  Post-op Assessment: Report given to PACU RN and Post -op Vital signs reviewed and stable  Post vital signs: Reviewed and stable  Dentition: Teeth and oropharynx remain in pre-op condition  Complications: No apparent anesthesia complications

## 2012-04-01 ENCOUNTER — Encounter (HOSPITAL_BASED_OUTPATIENT_CLINIC_OR_DEPARTMENT_OTHER): Payer: Self-pay | Admitting: Urology

## 2012-04-12 ENCOUNTER — Other Ambulatory Visit: Payer: Self-pay | Admitting: Endocrinology

## 2012-04-14 ENCOUNTER — Telehealth: Payer: Self-pay | Admitting: Endocrinology

## 2012-04-14 NOTE — Telephone Encounter (Signed)
please call patient: cpx is due 

## 2012-04-14 NOTE — Telephone Encounter (Signed)
Patient request refill on medication Crestor. Last OV 07/24/2011.

## 2012-04-17 ENCOUNTER — Telehealth: Payer: Self-pay | Admitting: Endocrinology

## 2012-04-17 NOTE — Telephone Encounter (Signed)
Called patient to schedule cpx per Dr. George Hugh request and patient stated she is now going to Dr. Jonny Ruiz at [facility] for her pcp visits. The patient stated that Dr. Jonny Ruiz has been following her since her bladder cancer diagnosis.  No appointment scheduled per patient request.

## 2012-04-17 NOTE — Telephone Encounter (Signed)
ok 

## 2012-05-06 ENCOUNTER — Other Ambulatory Visit: Payer: Self-pay | Admitting: Rheumatology

## 2012-05-06 ENCOUNTER — Ambulatory Visit
Admission: RE | Admit: 2012-05-06 | Discharge: 2012-05-06 | Disposition: A | Discharge status: Home / Self Care | Payer: PRIVATE HEALTH INSURANCE | Source: Ambulatory Visit | Attending: Rheumatology | Admitting: Rheumatology

## 2012-05-07 ENCOUNTER — Other Ambulatory Visit: Payer: Self-pay | Admitting: Rheumatology

## 2012-05-08 ENCOUNTER — Ambulatory Visit
Admission: RE | Admit: 2012-05-08 | Discharge: 2012-05-08 | Disposition: A | Discharge status: Home / Self Care | Payer: PRIVATE HEALTH INSURANCE | Source: Ambulatory Visit | Attending: Rheumatology | Admitting: Rheumatology

## 2012-05-08 DIAGNOSIS — M25551 Pain in right hip: Secondary | ICD-10-CM

## 2012-05-08 MED ORDER — IOHEXOL 180 MG/ML  SOLN
1.0000 mL | Freq: Once | INTRAMUSCULAR | Status: AC | PRN
  Administered 2012-05-08: 1 mL via INTRA_ARTICULAR

## 2012-05-08 MED ORDER — METHYLPREDNISOLONE ACETATE 40 MG/ML INJ SUSP (RADIOLOG
120.0000 mg | Freq: Once | INTRAMUSCULAR | Status: AC
  Administered 2012-05-08: 120 mg via INTRA_ARTICULAR

## 2012-05-12 ENCOUNTER — Other Ambulatory Visit: Payer: Self-pay | Admitting: Endocrinology

## 2012-05-13 NOTE — Telephone Encounter (Signed)
Dr Jonny Ruiz i spt's pcp

## 2012-06-13 ENCOUNTER — Other Ambulatory Visit: Payer: Self-pay | Admitting: Endocrinology

## 2012-06-13 ENCOUNTER — Other Ambulatory Visit: Payer: Self-pay | Admitting: *Deleted

## 2012-06-13 MED ORDER — LEVOTHYROXINE SODIUM 75 MCG PO TABS: 75.0000 ug | ORAL_TABLET | Freq: Every day | ORAL | Status: DC

## 2012-06-13 MED ORDER — POTASSIUM CHLORIDE CRYS ER 20 MEQ PO TBCR: EXTENDED_RELEASE_TABLET | ORAL | Status: DC

## 2012-06-13 MED ORDER — TRIAMTERENE-HCTZ 37.5-25 MG PO TABS: ORAL_TABLET | ORAL | Status: DC

## 2012-06-17 ENCOUNTER — Telehealth: Payer: Self-pay

## 2012-06-17 DIAGNOSIS — Z0000 Encounter for general adult medical examination without abnormal findings: Secondary | ICD-10-CM

## 2012-06-17 NOTE — Telephone Encounter (Signed)
CPX labs ordered

## 2012-06-28 ENCOUNTER — Emergency Department (INDEPENDENT_AMBULATORY_CARE_PROVIDER_SITE_OTHER)
Admission: EM | Admit: 2012-06-28 | Discharge: 2012-06-28 | Disposition: A | Discharge status: Home / Self Care | Payer: Medicaid Other | Source: Home / Self Care

## 2012-06-28 ENCOUNTER — Encounter (HOSPITAL_COMMUNITY): Payer: Self-pay | Admitting: *Deleted

## 2012-06-28 DIAGNOSIS — L923 Foreign body granuloma of the skin and subcutaneous tissue: Secondary | ICD-10-CM

## 2012-06-28 DIAGNOSIS — W57XXXA Bitten or stung by nonvenomous insect and other nonvenomous arthropods, initial encounter: Secondary | ICD-10-CM

## 2012-06-28 DIAGNOSIS — S70352A Superficial foreign body, left thigh, initial encounter: Secondary | ICD-10-CM

## 2012-06-28 NOTE — ED Provider Notes (Signed)
Medical screening examination/treatment/procedure(s) were performed by non-physician practitioner and as supervising physician I was immediately available for consultation/collaboration.  Leslee Home, M.D.  Reuben Likes, MD 06/28/12 [phone]

## 2012-06-28 NOTE — ED Provider Notes (Signed)
History     CSN: [id number]  Arrival date & time 06/28/12  1200   None     Chief Complaint  Patient presents with  . Tick Removal    (Consider location/radiation/quality/duration/timing/severity/associated sxs/prior treatment) HPI Comments: 72 year old female noticed a spot on her left waist to approximately 4-5 days ago. She initially thought it was a mole that began to itch and currently it. She swiped away and thought that it had fallen off. Later she noticed a similar dark spot connected to the left thigh. She was then told that it looked like a tick. The initial appearing by in the waist has produced an annular area of mild erythema and induration associated with pruritus. The actual tick is embedded in the left anterolateral thigh. The surrounding skin changes. No evidence of engorgement of the tick. Patient denies systemic symptoms or illness.   Past Medical History  Diagnosis Date  . HYPOTHYROIDISM, POST-RADIATION 04/06/2008  . HYPERLIPIDEMIA 12/30/2006  . HYPERTENSION 12/30/2006  . COLONIC POLYPS, HX OF 12/30/2006  . OSTEOPOROSIS 12/30/2006  . History of nephrolithiasis 08/31/2011  . 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 Bladder tumor   . Heart murmur   . History of rheumatic fever CHILD  . Short of breath on exertion   . Rheumatoid arthritis FOLLOWED DR TRUESLOW  . Smokers' cough   . Diabetes mellitus type 2, diet-controlled   . Bladder cancer 03/17/2012  . Hx of transfusion   . HOH (hard of hearing)     slightly    Past Surgical History  Procedure Laterality Date  . Total abdominal hysterectomy w/ bilateral salpingoophorectomy  1970's  . I-131 therapy  06/1999  . Nose surgery  1990    DISCOID LUPUS  . Transurethral resection of bladder tumor  12/24/2011    Procedure: TRANSURETHRAL RESECTION OF BLADDER TUMOR (TURBT);  Surgeon: Garnett Farm, MD;  Location: [facility];  Service: Urology;  Laterality: N/A;  1 hour requested  for this case  CAMERA  . Cystoscopy with biopsy  03/31/2012    Procedure: CYSTOSCOPY WITH BIOPSY;  Surgeon: Garnett Farm, MD;  Location: [facility];  Service: Urology;  Laterality: N/A;  CYSTOSCOPY WITH BLADDER BIOPSY     Family History  Problem Relation Age of Onset  . Cancer Mother     Pancreatic Cancer    History  Substance Use Topics  . Smoking status: Current Every Day Smoker -- 1.50 packs/day for 45 years    Types: Cigarettes  . Smokeless tobacco: Never Used  . Alcohol Use: No    OB History   Grav Para Term Preterm Abortions TAB SAB Ect Mult Living                  Review of Systems  Constitutional: Negative.   HENT: Negative.   Respiratory: Negative.   Gastrointestinal: Negative.   Neurological: Negative.   Hematological: Negative.     Allergies  Review of patient's allergies indicates no known allergies.  Home Medications   Current Outpatient Rx  Name  Route  Sig  Dispense  Refill  . aspirin 325 MG tablet   Oral   Take 325 mg by mouth 2 (two) times daily.          . CRESTOR 40 MG tablet      take 1 tablet by mouth once daily   30 tablet   5   . HYDROcodone-acetaminophen (NORCO) 7.5-325 MG per tablet  Oral   Take 1-2 tablets by mouth every 4 (four) hours as needed for pain.   10 tablet   0   . levothyroxine (SYNTHROID, LEVOTHROID) 75 MCG tablet      take 1 tablet by mouth once daily   30 tablet   5   . levothyroxine (SYNTHROID, LEVOTHROID) 75 MCG tablet   Oral   Take 1 tablet (75 mcg total) by mouth daily before breakfast.   30 tablet   1     PATIENT NEEDS TO SCHEDULE AN OFFICE VISIT.   Marland Kitchen meloxicam (MOBIC) 15 MG tablet   Oral   Take 1 tablet (15 mg total) by mouth daily.   90 tablet   1   . phenazopyridine (PYRIDIUM) 200 MG tablet   Oral   Take 1 tablet (200 mg total) by mouth 3 (three) times daily as needed for pain.   12 tablet   0   . potassium chloride (K-DUR) 10 MEQ tablet   Oral   Take 10 mEq by  mouth daily. TAKES ONLY M/W/ F         . potassium chloride SA (KLOR-CON M20) 20 MEQ tablet      TAKE 1/2 TABLET BY MOUTH EVERY Monday, Wednesday and Friday   15 tablet   1     PATIENT NEEDS TO SCHEDULE AN OFFICE VISIT.   Marland Kitchen rosuvastatin (CRESTOR) 40 MG tablet               . triamcinolone cream (KENALOG) 0.1 %   Topical   Apply topically 2 (two) times daily.   30 g   0   . triamterene-hydrochlorothiazide (MAXZIDE-25) 37.5-25 MG per tablet      take 1/2 tablet by mouth ON MONDAY, [facility] AND FRIDAY   15 tablet   3   . triamterene-hydrochlorothiazide (MAXZIDE-25) 37.5-25 MG per tablet      TAKE 1/2 TABLET BY MOUTH EVERY Monday, Wednesday and Friday.   15 tablet   1     PATIENT NEEDS TO SCHEDULE AN OFFICE VISIT.     BP 148/94  Pulse 75  Temp(Src) 98.4 F (36.9 C) (Oral)  Resp 16  SpO2 100%  Physical Exam  Nursing note and vitals reviewed. Constitutional: She is oriented to person, place, and time. She appears well-developed and well-nourished. No distress.  Eyes: EOM are normal.  Neck: Neck supple.  Cardiovascular: Normal rate.   Pulmonary/Chest: Effort normal. No respiratory distress.  Musculoskeletal: She exhibits no edema.  Neurological: She is alert and oriented to person, place, and time. She exhibits normal muscle tone.  Skin: Skin is warm and dry.  See history of present illness. 4 cm diameter area of erythema and pruritus of the left waist. Well marginated, no lymphangitis, drainage or signs of infection.  Small tick embedded into the left thigh.  Psychiatric: She has a normal mood and affect.    ED Course  FOREIGN BODY REMOVAL Date/Time: 06/28/2012 2:50 PM Performed by: Phineas Real, Eliyanah Elgersma Authorized by: Phineas Real, Brylan Seubert Consent: Verbal consent obtained. Risks and benefits: risks, benefits and alternatives were discussed Consent given by: patient Patient understanding: patient states understanding of the procedure being performed Patient identity  confirmed: verbally with patient Body area: skin General location: lower extremity Location details: left upper leg Patient sedated: no Patient restrained: no Localization method: visualized Tendon involvement: none Complexity: simple 1 objects recovered. Post-procedure assessment: foreign body removed Comments: The tick was removed from the dermis with the index finger and thumb. No anesthesia  needed.   (including critical care time)  Labs Reviewed - No data to display No results found.   1. Embedded tick of thigh, left, initial encounter   2. Foreign body reaction of the skin       MDM  Tick easily removed with thumb and forefinger. Entire tick parts removed. The area of erythema located on the left waist as a result from the initial bite. It is raised and mildly indurated typical of a local skin reaction to a sting or foreign body. No lymphangitis or signs of cellulitis. May apply Cortaid cream and or Benadryl cream to this area for comfort. Instructions for [facility] spotted fever and Lyme disease given to the patient. For development of any the symptoms seek medical care promptly.        Hayden Rasmussen, NP 06/28/12 1506

## 2012-06-28 NOTE — ED Notes (Signed)
Pt  Noticed  A  Tick l  Side    3  Days  Ago       She reports  It  Came  Off and  Apparently lodged  Itself  In l  Thigh   -  She  Has  A  Quarter    Sized  Red raised  Area  l  Side     That itchs  As well  As  A  Possible imbeeded  Tick l thigh

## 2012-07-03 ENCOUNTER — Encounter: Payer: Self-pay | Admitting: Internal Medicine

## 2012-07-03 ENCOUNTER — Ambulatory Visit (INDEPENDENT_AMBULATORY_CARE_PROVIDER_SITE_OTHER): Payer: Medicare Other | Admitting: Internal Medicine

## 2012-07-03 VITALS — BP 122/82 | HR 94 | Temp 98.0°F | Ht 67.0 in | Wt 184.2 lb

## 2012-07-03 DIAGNOSIS — S30861A Insect bite (nonvenomous) of abdominal wall, initial encounter: Secondary | ICD-10-CM | POA: Insufficient documentation

## 2012-07-03 DIAGNOSIS — M76899 Other specified enthesopathies of unspecified lower limb, excluding foot: Secondary | ICD-10-CM

## 2012-07-03 DIAGNOSIS — M7071 Other bursitis of hip, right hip: Secondary | ICD-10-CM

## 2012-07-03 DIAGNOSIS — W57XXXA Bitten or stung by nonvenomous insect and other nonvenomous arthropods, initial encounter: Secondary | ICD-10-CM | POA: Insufficient documentation

## 2012-07-03 DIAGNOSIS — I10 Essential (primary) hypertension: Secondary | ICD-10-CM

## 2012-07-03 DIAGNOSIS — B370 Candidal stomatitis: Secondary | ICD-10-CM

## 2012-07-03 MED ORDER — MELOXICAM 15 MG PO TABS: 15.0000 mg | ORAL_TABLET | Freq: Every day | ORAL | Status: DC

## 2012-07-03 MED ORDER — NYSTATIN 100000 UNIT/ML MT SUSP: 500000.0000 [IU] | Freq: Four times a day (QID) | OROMUCOSAL | Status: DC

## 2012-07-03 MED ORDER — HYDROCODONE-ACETAMINOPHEN 7.5-325 MG PO TABS: ORAL_TABLET | ORAL | Status: DC

## 2012-07-03 NOTE — Assessment & Plan Note (Signed)
For pain med refill,  to f/u any worsening symptoms or concerns 

## 2012-07-03 NOTE — Assessment & Plan Note (Signed)
stable overall by history and exam, recent data reviewed with pt, and pt to continue medical treatment as before,  to f/u any worsening symptoms or concerns BP Readings from Last 3 Encounters:  07/03/12 122/82  06/28/12 148/94  03/31/12 114/69

## 2012-07-03 NOTE — Assessment & Plan Note (Signed)
Benign exam, for benadryl cream prn,  to f/u any worsening symptoms or concerns

## 2012-07-03 NOTE — Patient Instructions (Addendum)
Your pain medication was refilled today Please take all new medication as prescribed - the nystatin solution for mouth treatment You can also use the OTC benadryl cream for the itching from the tick bites as needed Please continue all other medications as before, and refills have been done if requested. Please have the pharmacy call with any other refills you may need. No further blood work needed today Please keep your appointments with your specialists as you have planned Thank you for enrolling in MyChart. Please follow the instructions below to securely access your online medical record. MyChart allows you to send messages to your doctor, view your test results, renew your prescriptions, schedule appointments, and more Please return in 6 months, or sooner if needed

## 2012-07-03 NOTE — Progress Notes (Signed)
Subjective:    Patient ID: Jennifer Ball, female    DOB: [birth year], 72 y.o.   MRN: [id number]  HPI  Here to f/u with daughter  Pt denies chest pain, increased sob or doe, wheezing, orthopnea, PND, increased LE swelling, palpitations, dizziness or syncope.  Pt denies polydipsia, polyuria,.  Pt denies new neurological symptoms such as new headache, or facial or extremity weakness or numbness.   Pt states overall good compliance with meds, and needs pain med refill for ongoing right hip DJD not improved signficantly with recent cortisone.  Did have recent tick bite to left abd,still itches but no pain, redness or drainage. Also with an uncomfortable white coating to tongue for 1 wk Past Medical History  Diagnosis Date  . HYPOTHYROIDISM, POST-RADIATION 04/06/2008  . HYPERLIPIDEMIA 12/30/2006  . HYPERTENSION 12/30/2006  . COLONIC POLYPS, HX OF 12/30/2006  . OSTEOPOROSIS 12/30/2006  . History of nephrolithiasis 08/31/2011  . 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 Bladder tumor   . Heart murmur   . History of rheumatic fever CHILD  . Short of breath on exertion   . Rheumatoid arthritis FOLLOWED DR TRUESLOW  . Smokers' cough   . Diabetes mellitus type 2, diet-controlled   . Bladder cancer 03/17/2012  . Hx of transfusion   . HOH (hard of hearing)     slightly   Past Surgical History  Procedure Laterality Date  . Total abdominal hysterectomy w/ bilateral salpingoophorectomy  1970's  . I-131 therapy  06/1999  . Nose surgery  1990    DISCOID LUPUS  . Transurethral resection of bladder tumor  12/24/2011    Procedure: TRANSURETHRAL RESECTION OF BLADDER TUMOR (TURBT);  Surgeon: Garnett Farm, MD;  Location: [facility];  Service: Urology;  Laterality: N/A;  1 hour requested for this case  [facility]ER[facility]  . Cystoscopy with biopsy  03/31/2012    Procedure: CYSTOSCOPY WITH BIOPSY;  Surgeon: Garnett Farm, MD;  Location: [facility];  Service: Urology;   Laterality: N/A;  CYSTOSCOPY WITH BLADDER BIOPSY     reports that she has been smoking Cigarettes.  She has a 67.5 pack-year smoking history. She has never used smokeless tobacco. She reports that she does not drink alcohol or use illicit drugs. family history includes Cancer in her mother. No Known Allergies Current Outpatient Prescriptions on File Prior to Visit  Medication Sig Dispense Refill  . aspirin 325 MG tablet Take 325 mg by mouth 2 (two) times daily.       . CRESTOR 40 MG tablet take 1 tablet by mouth once daily  30 tablet  5  . levothyroxine (SYNTHROID, LEVOTHROID) 75 MCG tablet Take 1 tablet (75 mcg total) by mouth daily before breakfast.  30 tablet  1  . phenazopyridine (PYRIDIUM) 200 MG tablet Take 1 tablet (200 mg total) by mouth 3 (three) times daily as needed for pain.  12 tablet  0  . potassium chloride SA (KLOR-CON M20) 20 MEQ tablet TAKE 1/2 TABLET BY MOUTH EVERY Monday, Wednesday and Friday  15 tablet  1  . rosuvastatin (CRESTOR) 40 MG tablet       . triamcinolone cream (KENALOG) 0.1 % Apply topically 2 (two) times daily.  30 g  0  . triamterene-hydrochlorothiazide (MAXZIDE-25) 37.5-25 MG per tablet TAKE 1/2 TABLET BY MOUTH EVERY Monday, Wednesday and Friday.  15 tablet  1   Current Facility-Administered Medications on File Prior to Visit  Medication Dose Route Frequency  Provider Last Rate Last Dose  . mitomycin (MUTAMYCIN) chemo injection 40 mg  40 mg Bladder Instillation Once Garnett Farm, MD       Review of Systems  Constitutional: Negative for unexpected weight change, or unusual diaphoresis  HENT: Negative for tinnitus.   Eyes: Negative for photophobia and visual disturbance.  Respiratory: Negative for choking and stridor.   Gastrointestinal: Negative for vomiting and blood in stool.  Genitourinary: Negative for hematuria and decreased urine volume.  Musculoskeletal: Negative for acute joint swelling Skin: Negative for color change and wound.  Neurological:  Negative for tremors and numbness other than noted  Psychiatric/Behavioral: Negative for decreased concentration or  hyperactivity.       Objective:   Physical Exam BP 122/82  Pulse 94  Temp(Src) 98 F (36.7 C) (Oral)  Ht 5\' 7"  (1.702 m)  Wt 184 lb 4 oz (83.575 kg)  BMI 28.85 kg/m2  SpO2 95% VS noted, not ill appearing Constitutional: Pt appears well-developed and well-nourished.  HENT: Head: NCAT.  Right Ear: External ear normal.  Left Ear: External ear normal. Tongue with whitish coating, thrush like  Eyes: Conjunctivae and EOM are normal. Pupils are equal, round, and reactive to light.  Neck: Normal range of motion. Neck supple.  Cardiovascular: Normal rate and regular rhythm.   Pulmonary/Chest: Effort normal and breath sounds normal.  Abd:  Soft, NT, non-distended, + BS with insect bite area to left mid abd without cellulitis Neurological: Pt is alert. Not confused , some HOH Skin: Skin is warm. No erythema. No rash Psychiatric: Pt behavior is normal. Thought content normal.     Assessment & Plan:

## 2012-07-03 NOTE — Assessment & Plan Note (Signed)
Mild to mod, for antibx course,  to f/u any worsening symptoms or concerns 

## 2012-07-18 ENCOUNTER — Telehealth: Payer: Self-pay | Admitting: Internal Medicine

## 2012-07-18 MED ORDER — TRIAMTERENE-HCTZ 37.5-25 MG PO TABS: ORAL_TABLET | ORAL | Status: DC

## 2012-07-18 MED ORDER — LEVOTHYROXINE SODIUM 75 MCG PO TABS: 75.0000 ug | ORAL_TABLET | Freq: Every day | ORAL | Status: DC

## 2012-07-18 MED ORDER — POTASSIUM CHLORIDE CRYS ER 20 MEQ PO TBCR: EXTENDED_RELEASE_TABLET | ORAL | Status: DC

## 2012-07-18 NOTE — Telephone Encounter (Signed)
Pt needs an RX for the generic for Maxide.  Her daughter said it was changed to 1/2 tablet every day on her April 24 visit.  They also need new RS's for Levothroid and potassium.  The pharmacy is [facility] on [address]

## 2012-07-18 NOTE — Telephone Encounter (Signed)
Ok to robin to handle routine meds

## 2012-07-30 ENCOUNTER — Encounter: Payer: PRIVATE HEALTH INSURANCE | Admitting: Internal Medicine

## 2012-09-15 ENCOUNTER — Ambulatory Visit: Payer: PRIVATE HEALTH INSURANCE | Admitting: Internal Medicine

## 2012-11-10 ENCOUNTER — Other Ambulatory Visit: Payer: Self-pay | Admitting: Internal Medicine

## 2013-01-07 ENCOUNTER — Ambulatory Visit (INDEPENDENT_AMBULATORY_CARE_PROVIDER_SITE_OTHER): Payer: Medicare Other | Admitting: Internal Medicine

## 2013-01-07 ENCOUNTER — Encounter: Payer: Self-pay | Admitting: Internal Medicine

## 2013-01-07 VITALS — BP 142/90 | HR 95 | Temp 99.5°F | Ht 65.0 in | Wt 178.5 lb

## 2013-01-07 DIAGNOSIS — K219 Gastro-esophageal reflux disease without esophagitis: Secondary | ICD-10-CM

## 2013-01-07 DIAGNOSIS — E785 Hyperlipidemia, unspecified: Secondary | ICD-10-CM

## 2013-01-07 DIAGNOSIS — Z23 Encounter for immunization: Secondary | ICD-10-CM

## 2013-01-07 DIAGNOSIS — I10 Essential (primary) hypertension: Secondary | ICD-10-CM

## 2013-01-07 DIAGNOSIS — E119 Type 2 diabetes mellitus without complications: Secondary | ICD-10-CM

## 2013-01-07 DIAGNOSIS — Z0000 Encounter for general adult medical examination without abnormal findings: Secondary | ICD-10-CM

## 2013-01-07 HISTORY — DX: Gastro-esophageal reflux disease without esophagitis: K21.9

## 2013-01-07 MED ORDER — TRIAMCINOLONE ACETONIDE 0.1 % EX CREA: TOPICAL_CREAM | Freq: Two times a day (BID) | CUTANEOUS | Status: DC

## 2013-01-07 MED ORDER — PANTOPRAZOLE SODIUM 40 MG PO TBEC: 40.0000 mg | DELAYED_RELEASE_TABLET | Freq: Every day | ORAL | Status: DC

## 2013-01-07 MED ORDER — POTASSIUM CHLORIDE ER 10 MEQ PO TBCR: EXTENDED_RELEASE_TABLET | ORAL | Status: DC

## 2013-01-07 MED ORDER — ROSUVASTATIN CALCIUM 20 MG PO TABS: 20.0000 mg | ORAL_TABLET | Freq: Every day | ORAL | Status: DC

## 2013-01-07 MED ORDER — POTASSIUM CHLORIDE ER 10 MEQ PO TBCR: 10.0000 meq | EXTENDED_RELEASE_TABLET | Freq: Two times a day (BID) | ORAL | Status: DC

## 2013-01-07 NOTE — Progress Notes (Signed)
Subjective:    Patient ID: Jennifer Ball, female    DOB: [birth year], 72 y.o.   MRN: [id number]  HPI  Here to fu; mentions Drinking plenty of fluids per urology, now about 6 glasses per day.  Bp usually better controlled at home.  Has recent increased reflux symtpoms not controlled with diet, Denies worsening abd pain, dysphagia, n/v, bowel change or blood. Pt denies chest pain, increased sob or doe, wheezing, orthopnea, PND, increased LE swelling, palpitations, dizziness or syncope.  Pt denies polydipsia, polyuria. Pt denies new neurological symptoms such as new headache, or facial or extremity weakness or numbness Past Medical History  Diagnosis Date  . HYPOTHYROIDISM, POST-RADIATION 04/06/2008  . HYPERLIPIDEMIA 12/30/2006  . HYPERTENSION 12/30/2006  . COLONIC POLYPS, HX OF 12/30/2006  . OSTEOPOROSIS 12/30/2006  . History of nephrolithiasis 08/31/2011  . 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 Bladder tumor   . Heart murmur   . History of rheumatic fever CHILD  . Short of breath on exertion   . Rheumatoid arthritis(714.0) FOLLOWED DR TRUESLOW  . Smokers' cough   . Diabetes mellitus type 2, diet-controlled   . Bladder cancer 03/17/2012  . Hx of transfusion   . HOH (hard of hearing)     slightly   Past Surgical History  Procedure Laterality Date  . Total abdominal hysterectomy w/ bilateral salpingoophorectomy  1970's  . I-131 therapy  06/1999  . Nose surgery  1990    DISCOID LUPUS  . Transurethral resection of bladder tumor  12/24/2011    Procedure: TRANSURETHRAL RESECTION OF BLADDER TUMOR (TURBT);  Surgeon: Garnett Farm, MD;  Location: [facility];  Service: Urology;  Laterality: N/A;  1 hour requested for this case  CAMERA  . Cystoscopy with biopsy  03/31/2012    Procedure: CYSTOSCOPY WITH BIOPSY;  Surgeon: Garnett Farm, MD;  Location: [facility];  Service: Urology;  Laterality: N/A;  CYSTOSCOPY WITH BLADDER BIOPSY     reports  that she has been smoking Cigarettes.  She has a 67.5 pack-year smoking history. She has never used smokeless tobacco. She reports that she does not drink alcohol or use illicit drugs. family history includes Cancer in her mother. No Known Allergies Current Outpatient Prescriptions on File Prior to Visit  Medication Sig Dispense Refill  . aspirin 325 MG tablet Take 325 mg by mouth 2 (two) times daily.       Marland Kitchen HYDROcodone-acetaminophen (NORCO) 7.5-325 MG per tablet 1 tab by mouth twice per day as needed  60 tablet  1  . levothyroxine (SYNTHROID, LEVOTHROID) 75 MCG tablet Take 1 tablet (75 mcg total) by mouth daily before breakfast.  30 tablet  11  . meloxicam (MOBIC) 15 MG tablet Take 1 tablet (15 mg total) by mouth daily.  90 tablet  3  . nystatin (MYCOSTATIN) 100000 UNIT/ML suspension Take 5 mLs (500,000 Units total) by mouth 4 (four) times daily.  60 mL  1  . phenazopyridine (PYRIDIUM) 200 MG tablet Take 1 tablet (200 mg total) by mouth 3 (three) times daily as needed for pain.  12 tablet  0  . triamterene-hydrochlorothiazide (MAXZIDE-25) 37.5-25 MG per tablet TAKE 1/2 TABLET BY MOUTH EVERY Monday, Wednesday and Friday.  15 tablet  1   Current Facility-Administered Medications on File Prior to Visit  Medication Dose Route Frequency Provider Last Rate Last Dose  . mitomycin (MUTAMYCIN) chemo injection 40 mg  40 mg Bladder Instillation Once Garnett Farm,  MD       Review of Systems  Constitutional: Negative for unexpected weight change, or unusual diaphoresis  HENT: Negative for tinnitus.   Eyes: Negative for photophobia and visual disturbance.  Respiratory: Negative for choking and stridor.   Gastrointestinal: Negative for vomiting and blood in stool.  Genitourinary: Negative for hematuria and decreased urine volume.  Musculoskeletal: Negative for acute joint swelling Skin: Negative for color change and wound.  Neurological: Negative for tremors and numbness other than noted   Psychiatric/Behavioral: Negative for decreased concentration or  hyperactivity.       Objective:   Physical Exam BP 142/90  Pulse 95  Temp(Src) 99.5 F (37.5 C) (Oral)  Ht 5\' 5"  (1.651 m)  Wt 178 lb 8 oz (80.967 kg)  BMI 29.7 kg/m2  SpO2 95% VS noted,  Constitutional: Pt appears well-developed and well-nourished.  HENT: Head: NCAT.  Right Ear: External ear normal.  Left Ear: External ear normal.  Eyes: Conjunctivae and EOM are normal. Pupils are equal, round, and reactive to light.  Neck: Normal range of motion. Neck supple.  Cardiovascular: Normal rate and regular rhythm.   Pulmonary/Chest: Effort normal and breath sounds normal.  Abd:  Soft, NT, non-distended, + BS Neurological: Pt is alert. Not confused  Skin: Skin is warm. No erythema.  Psychiatric: Pt behavior is normal. Thought content normal.     Assessment & Plan:

## 2013-01-07 NOTE — Assessment & Plan Note (Signed)
New worsening, no red flags , for OTC nexium except if too expensvie ok for protonix 40 qd rx

## 2013-01-07 NOTE — Assessment & Plan Note (Signed)
stable overall by history and exam, recent data reviewed with pt, and pt to continue medical treatment as before,  to f/u any worsening symptoms or concerns BP Readings from Last 3 Encounters:  01/07/13 142/90  07/03/12 122/82  06/28/12 148/94

## 2013-01-07 NOTE — Assessment & Plan Note (Signed)
stable overall by history and exam, recent data reviewed with pt, and pt to continue medical treatment as before,  to f/u any worsening symptoms or concerns Lab Results  Component Value Date   LDLCALC 61 03/17/2012

## 2013-01-07 NOTE — Assessment & Plan Note (Signed)
Diet controlled, for f/u lab next visit, stable

## 2013-01-07 NOTE — Patient Instructions (Addendum)
You had the flu shot today Please return in 2 wks for a Nurse Visit for the new Prevnar pneumonia shot Please take all new medication as prescribed - the generic Protonix 40 mg per day if the OTC nexium you mentioned does not work well Please continue all other medications as before, and refills have been done if requested. - the cream Please have the pharmacy call with any other refills you may need.  No further blood work needed today  Please return in 3 months, or sooner if needed, with Lab testing done 3-5 days before

## 2013-01-28 ENCOUNTER — Ambulatory Visit: Payer: Medicare Other

## 2013-02-04 ENCOUNTER — Other Ambulatory Visit: Payer: Self-pay | Admitting: Internal Medicine

## 2013-04-15 ENCOUNTER — Encounter: Payer: Self-pay | Admitting: Internal Medicine

## 2013-04-15 ENCOUNTER — Other Ambulatory Visit (INDEPENDENT_AMBULATORY_CARE_PROVIDER_SITE_OTHER): Payer: Medicare Other

## 2013-04-15 ENCOUNTER — Ambulatory Visit (INDEPENDENT_AMBULATORY_CARE_PROVIDER_SITE_OTHER): Payer: Medicare Other | Admitting: Internal Medicine

## 2013-04-15 DIAGNOSIS — I10 Essential (primary) hypertension: Secondary | ICD-10-CM

## 2013-04-15 DIAGNOSIS — R7309 Other abnormal glucose: Secondary | ICD-10-CM

## 2013-04-15 DIAGNOSIS — Z0000 Encounter for general adult medical examination without abnormal findings: Secondary | ICD-10-CM

## 2013-04-15 DIAGNOSIS — K5900 Constipation, unspecified: Secondary | ICD-10-CM

## 2013-04-15 DIAGNOSIS — M329 Systemic lupus erythematosus, unspecified: Secondary | ICD-10-CM

## 2013-04-15 DIAGNOSIS — E785 Hyperlipidemia, unspecified: Secondary | ICD-10-CM

## 2013-04-15 DIAGNOSIS — R7302 Impaired glucose tolerance (oral): Secondary | ICD-10-CM

## 2013-04-15 DIAGNOSIS — K921 Melena: Secondary | ICD-10-CM

## 2013-04-15 LAB — BASIC METABOLIC PANEL
BUN: 9 mg/dL (ref 6–23)
CHLORIDE: 104 meq/L (ref 96–112)
CO2: 25 meq/L (ref 19–32)
Calcium: 9.3 mg/dL (ref 8.4–10.5)
Creatinine, Ser: 0.8 mg/dL (ref 0.4–1.2)
GFR: 94.52 mL/min (ref >60.00)
Glucose, Bld: 94 mg/dL (ref 70–99)
Potassium: 4.5 mEq/L (ref 3.5–5.1)
Sodium: 138 mEq/L (ref 135–145)

## 2013-04-15 LAB — CBC WITH DIFFERENTIAL/PLATELET
BASOS ABS: 0 10*3/uL (ref 0.0–0.1)
Basophils Relative: 0.3 % (ref 0.0–3.0)
Eosinophils Absolute: 0 10*3/uL (ref 0.0–0.7)
Eosinophils Relative: 0.7 % (ref 0.0–5.0)
HEMATOCRIT: 39.3 % (ref 36.0–46.0)
Hemoglobin: 13.1 g/dL (ref 12.0–15.0)
LYMPHS ABS: 1.1 10*3/uL (ref 0.7–4.0)
Lymphocytes Relative: 20.7 % (ref 12.0–46.0)
MCHC: 33.2 g/dL (ref 30.0–36.0)
MCV: 95 fl (ref 78.0–100.0)
Monocytes Absolute: 0.5 10*3/uL (ref 0.1–1.0)
Monocytes Relative: 9.7 % (ref 3.0–12.0)
NEUTROS ABS: 3.8 10*3/uL (ref 1.4–7.7)
Neutrophils Relative %: 68.6 % (ref 43.0–77.0)
Platelets: 268 10*3/uL (ref 150.0–400.0)
RBC: 4.14 Mil/uL (ref 3.87–5.11)
RDW: 15.6 % — AB (ref 11.5–14.6)
WBC: 5.5 10*3/uL (ref 4.5–10.5)

## 2013-04-15 LAB — URINALYSIS, ROUTINE W REFLEX MICROSCOPIC
Bilirubin Urine: NEGATIVE
Hgb urine dipstick: NEGATIVE
Ketones, ur: NEGATIVE
Leukocytes, UA: NEGATIVE
Nitrite: NEGATIVE
RBC / HPF: NONE SEEN
Specific Gravity, Urine: 1.01
Total Protein, Urine: NEGATIVE
Urine Glucose: NEGATIVE
Urobilinogen, UA: 0.2
pH: 7 (ref 5.0–8.0)

## 2013-04-15 LAB — HEMOGLOBIN A1C: Hgb A1c MFr Bld: 6.1 % (ref 4.6–6.5)

## 2013-04-15 LAB — LIPID PANEL
Cholesterol: 116 mg/dL (ref 0–200)
HDL: 51.7 mg/dL
LDL Cholesterol: 57 mg/dL (ref 0–99)
Total CHOL/HDL Ratio: 2
Triglycerides: 38 mg/dL (ref 0.0–149.0)
VLDL: 7.6 mg/dL (ref 0.0–40.0)

## 2013-04-15 LAB — HEPATIC FUNCTION PANEL
ALT: 15 U/L (ref 0–35)
AST: 25 U/L (ref 0–37)
Albumin: 3.4 g/dL — ABNORMAL LOW (ref 3.5–5.2)
Alkaline Phosphatase: 68 U/L (ref 39–117)
Bilirubin, Direct: 0.2 mg/dL (ref 0.0–0.3)
Total Bilirubin: 1 mg/dL (ref 0.3–1.2)
Total Protein: 7.9 g/dL (ref 6.0–8.3)

## 2013-04-15 LAB — TSH: TSH: 0.65 u[IU]/mL (ref 0.35–5.50)

## 2013-04-15 NOTE — Progress Notes (Signed)
Pre-visit discussion using our clinic review tool. No additional management support is needed unless otherwise documented below in the visit note.  

## 2013-04-15 NOTE — Patient Instructions (Addendum)
Please continue all other medications as before, and refills have been done if requested. Please have the pharmacy call with any other refills you may need. Please continue your efforts at being more active, low cholesterol diet, and weight control. You are otherwise up to date with prevention measures today. Please keep your appointments with your specialists as you may have planned  Please go to the LAB in the Basement (turn left off the elevator) for the tests to be done today You will be contacted by phone if any changes need to be made immediately.  Otherwise, you will receive a letter about your results with an explanation, but please check with MyChart first.  Please remember to sign up for My Chart if you have not done so, as this will be important to you in the future with finding out test results, communicating by private email, and scheduling acute appointments online when needed.  You will be contacted regarding the referral for: colonoscopy, and mammogram, and Rheumatology - Dr Kathee Delton  Please return if you change your mind about the new Pneumonia shot.  Please return in 6 months, or sooner if needed  Please quit smoking

## 2013-04-15 NOTE — Assessment & Plan Note (Signed)
Pt requests referral rheum

## 2013-04-15 NOTE — Assessment & Plan Note (Signed)

## 2013-04-15 NOTE — Assessment & Plan Note (Signed)
For miralax dialy, colace bid prn,  to f/u any worsening symptoms or concerns

## 2013-04-15 NOTE — Assessment & Plan Note (Signed)
Painless, very small volume per pt this am, for colonscopy as she is due

## 2013-04-15 NOTE — Progress Notes (Signed)
Subjective:    Patient ID: Jennifer Ball, female    DOB: 1941/01/15, 73 y.o.   MRN: [id number]  HPI  Here for wellness and f/u;  Overall doing ok;  Pt denies CP, worsening SOB, DOE, wheezing, orthopnea, PND, worsening LE edema, palpitations, dizziness or syncope.  Pt denies neurological change such as new headache, facial or extremity weakness.  Pt denies polydipsia, polyuria, or low sugar symptoms. Pt states overall good compliance with treatment and medications, good tolerability, and has been trying to follow lower cholesterol diet.  Pt denies worsening depressive symptoms, suicidal ideation or panic. No fever, night sweats, wt loss, loss of appetite, or other constitutional symptoms.  Pt states good ability with ADL's, has low fall risk, home safety reviewed and adequate, no other significant changes in hearing or vision, and only occasionally active with exercise.  Taking asa 325 bid for arthritis.  Missed BP med today.  Has ongoing constipation usuajully well controlled with MOM about every other wk, but recently worse though she drinks plenty of fluids. Did note some small painless BRBPR this am with straining at stool, none since, asks for colonoscopy that she has declined in the past Past Medical History  Diagnosis Date  . HYPOTHYROIDISM, POST-RADIATION 04/06/2008  . HYPERLIPIDEMIA 12/30/2006  . HYPERTENSION 12/30/2006  . COLONIC POLYPS, HX OF 12/30/2006  . OSTEOPOROSIS 12/30/2006  . History of nephrolithiasis 08/31/2011  . 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 Bladder tumor   . Heart murmur   . History of rheumatic fever CHILD  . Short of breath on exertion   . Rheumatoid arthritis(714.0) FOLLOWED DR TRUESLOW  . Smokers' cough   . Diabetes mellitus type 2, diet-controlled   . Bladder cancer 03/17/2012  . Hx of transfusion   . HOH (hard of hearing)     slightly  . GERD (gastroesophageal reflux disease) 01/07/2013   Past Surgical History  Procedure Laterality  Date  . Total abdominal hysterectomy w/ bilateral salpingoophorectomy  1970's  . I-131 therapy  06/1999  . Nose surgery  1990    DISCOID LUPUS  . Transurethral resection of bladder tumor  12/24/2011    Procedure: TRANSURETHRAL RESECTION OF BLADDER TUMOR (TURBT);  Surgeon: Claybon Jabs, MD;  Location: [facility];  Service: Urology;  Laterality: N/A;  1 hour requested for this case  [facility]  . Cystoscopy with biopsy  03/31/2012    Procedure: CYSTOSCOPY WITH BIOPSY;  Surgeon: Claybon Jabs, MD;  Location: [facility];  Service: Urology;  Laterality: N/A;  CYSTOSCOPY WITH BLADDER BIOPSY     reports that she has been smoking Cigarettes.  She has a 67.5 pack-year smoking history. She has never used smokeless tobacco. She reports that she does not drink alcohol or use illicit drugs. family history includes Cancer in her mother. Allergies  Allergen Reactions  . Hydrocodone Nausea Only   Current Outpatient Prescriptions on File Prior to Visit  Medication Sig Dispense Refill  . aspirin 325 MG tablet Take 325 mg by mouth 2 (two) times daily.       Marland Kitchen levothyroxine (SYNTHROID, LEVOTHROID) 75 MCG tablet Take 1 tablet (75 mcg total) by mouth daily before breakfast.  30 tablet  11  . meloxicam (MOBIC) 15 MG tablet Take 1 tablet (15 mg total) by mouth daily.  90 tablet  3  . nystatin (MYCOSTATIN) 100000 UNIT/ML suspension Take 5 mLs (500,000 Units total) by mouth 4 (four) times daily.  [place]  mL  1  . pantoprazole (PROTONIX) 40 MG tablet Take 1 tablet (40 mg total) by mouth daily.  90 tablet  3  . phenazopyridine (PYRIDIUM) 200 MG tablet Take 1 tablet (200 mg total) by mouth 3 (three) times daily as needed for pain.  12 tablet  0  . potassium chloride (KLOR-CON 10) 10 MEQ tablet 1 tab every M-W-F only (not daily)  30 tablet  3  . rosuvastatin (CRESTOR) 20 MG tablet Take 1 tablet (20 mg total) by mouth daily.  90 tablet  3  . triamcinolone cream (KENALOG) 0.1 % Apply topically 2  (two) times daily.  30 g  0  . triamterene-hydrochlorothiazide (MAXZIDE-25) 37.5-25 MG per tablet take 1/2 tablet by mouth EVERY MONDAY,WEDNESDAY AND FRIDAY  15 tablet  1   Current Facility-Administered Medications on File Prior to Visit  Medication Dose Route Frequency Provider Last Rate Last Dose  . mitomycin (MUTAMYCIN) chemo injection 40 mg  40 mg Bladder Instillation Once Claybon Jabs, MD       Review of Systems Constitutional: Negative for diaphoresis, activity change, appetite change or unexpected weight change.  HENT: Negative for hearing loss, ear pain, facial swelling, mouth sores and neck stiffness.   Eyes: Negative for pain, redness and visual disturbance.  Respiratory: Negative for shortness of breath and wheezing.   Cardiovascular: Negative for chest pain and palpitations.  Gastrointestinal: Negative for diarrhea, blood in stool, abdominal distention or other pain Genitourinary: Negative for hematuria, flank pain or change in urine volume.  Musculoskeletal: Negative for myalgias and joint swelling.  Skin: Negative for color change and wound.  Neurological: Negative for syncope and numbness. other than noted Hematological: Negative for adenopathy.  Psychiatric/Behavioral: Negative for hallucinations, self-injury, decreased concentration and agitation.      Objective:   Physical Exam There were no vitals taken for this visit. VS noted,  Constitutional: Pt is oriented to person, place, and time. Appears well-developed and well-nourished.  Head: Normocephalic and atraumatic.  Right Ear: External ear normal.  Left Ear: External ear normal.  Nose: Nose normal.  Mouth/Throat: Oropharynx is clear and moist.  Eyes: Conjunctivae and EOM are normal. Pupils are equal, round, and reactive to light.  Neck: Normal range of motion. Neck supple. No JVD present. No tracheal deviation present.  Cardiovascular: Normal rate, regular rhythm, normal heart sounds and intact distal pulses.     Pulmonary/Chest: Effort normal and breath sounds normal.  Abdominal: Soft. Bowel sounds are normal. There is no tenderness. No HSM  Musculoskeletal: Normal range of motion. Exhibits no edema.  Lymphadenopathy:  Has no cervical adenopathy.  Neurological: Pt is alert and oriented to person, place, and time. Pt has normal reflexes. No cranial nerve deficit.  Skin: Skin is warm and dry. No rash noted.  Psychiatric:  Has 1-2+ nervous mood and affect. Behavior is normal    Assessment & Plan:

## 2013-04-15 NOTE — Assessment & Plan Note (Signed)
Asympt, for a1c 

## 2013-04-23 ENCOUNTER — Encounter: Payer: Self-pay | Admitting: Gastroenterology

## 2013-05-18 ENCOUNTER — Other Ambulatory Visit: Payer: Self-pay | Admitting: Internal Medicine

## 2013-05-27 ENCOUNTER — Ambulatory Visit (INDEPENDENT_AMBULATORY_CARE_PROVIDER_SITE_OTHER): Payer: PRIVATE HEALTH INSURANCE | Admitting: Gastroenterology

## 2013-05-27 ENCOUNTER — Encounter: Payer: Self-pay | Admitting: Gastroenterology

## 2013-05-27 VITALS — BP 128/82 | HR 88 | Ht 65.0 in | Wt 170.0 lb

## 2013-05-27 DIAGNOSIS — R634 Abnormal weight loss: Secondary | ICD-10-CM

## 2013-05-27 DIAGNOSIS — K921 Melena: Secondary | ICD-10-CM

## 2013-05-27 DIAGNOSIS — Z86010 Personal history of colonic polyps: Secondary | ICD-10-CM

## 2013-05-27 DIAGNOSIS — K5900 Constipation, unspecified: Secondary | ICD-10-CM

## 2013-05-27 MED ORDER — SOD PICOSULFATE-MAG OX-CIT ACD 10-3.5-12 MG-GM-GM PO PACK: 1.0000 | PACK | ORAL | Status: DC

## 2013-05-27 NOTE — Patient Instructions (Signed)
Start taking your prescription for Protonix daily.   You have been scheduled for an endoscopy and colonoscopy with propofol. Please follow the written instructions given to you at your visit today. Please pick up your prep at the pharmacy within the next 1-3 days. If you use inhalers (even only as needed), please bring them with you on the day of your procedure. Your physician has requested that you go to www.startemmi.com and enter the access code given to you at your visit today. This web site gives a general overview about your procedure. However, you should still follow specific instructions given to you by our office regarding your preparation for the procedure.  Thank you for choosing me and [facility].  Pricilla Riffle. Dagoberto Ligas., MD., Marval Regal

## 2013-05-27 NOTE — Progress Notes (Signed)
    History of Present Illness: This is a 73 year old female with constipation, hematochezia anorexia and weight loss. She's had a 14 pound weight loss over the past year associated with a decreased appetite. She is not taking Protonix. She's had problems with constipation over the past year as well but has recently responded well to daily use of a stool softener. She has noted a small amounts of bright blood per rectum with straining with hard bowel movement severe arthritis in her hip and has had cortisone injections. She takes Mobic, aspirin and tramadol for pain management. She underwent colonoscopy in March 2011 showing internal hemorrhoids and small adenomatous colon polyps. Abdomen CT scan performed at [facility] in August 2013 was unremarkable. Denies diarrhea, change in stool caliber, melena, nausea, vomiting, dysphagia, reflux symptoms, chest pain.  Review of Systems: Pertinent positive and negative review of systems were noted in the above HPI section. All other review of systems were otherwise negative.  Current Medications, Allergies, Past Medical History, Past Surgical History, Family History and Social History were reviewed in [employer] record.  Physical Exam: General: Well developed , well nourished, no acute distress Head: Normocephalic and atraumatic Eyes:  sclerae anicteric, EOMI Ears: Normal auditory acuity Mouth: No deformity or lesions Neck: Supple, no masses or thyromegaly Lungs: Clear throughout to auscultation Heart: Regular rate and rhythm; no murmurs, rubs or bruits Abdomen: Soft, non tender and non distended. No masses, hepatosplenomegaly or hernias noted. Normal Bowel sounds Rectal: Deferred to colonoscopy Musculoskeletal: Symmetrical with no gross deformities  Skin: No lesions on visible extremities Pulses:  Normal pulses noted Extremities: No clubbing, cyanosis, edema or deformities noted Neurological: Alert oriented x 4, grossly  nonfocal Cervical Nodes:  No significant cervical adenopathy Inguinal Nodes: No significant inguinal adenopathy Psychological:  Alert and cooperative. Normal mood and affect  Assessment and Recommendations:  1. Hematochezia, constipation, anorexia, weight loss and personal history of adenomatous colon polyps. Rule out colorectal neoplasms, ulcer, gastritis. She is advised to contact her rheumatologist to consider stopping either aspirin or Mobic, if possible. Begin Protonix 40 mg daily. Continue daily stool softeners. Schedule colonoscopy and EGD. The risks, benefits, and alternatives to colonoscopy with possible biopsy, possible destruction of internal hemorrhoids and possible polypectomy were discussed with the patient and they consent to proceed. The risks, benefits, and alternatives to endoscopy with possible biopsy and possible dilation were discussed with the patient and they consent to proceed.

## 2013-05-29 ENCOUNTER — Encounter: Payer: Self-pay | Admitting: Gastroenterology

## 2013-06-01 ENCOUNTER — Telehealth: Payer: Self-pay | Admitting: Gastroenterology

## 2013-06-01 NOTE — Telephone Encounter (Signed)
Called patient with no answer and no voicemail. 

## 2013-06-02 NOTE — Telephone Encounter (Signed)
Told patient we have a free prep to leave up front for her to pick up. Pt will come by and pick up.

## 2013-06-27 ENCOUNTER — Other Ambulatory Visit: Payer: Self-pay | Admitting: Internal Medicine

## 2013-07-07 ENCOUNTER — Ambulatory Visit (HOSPITAL_COMMUNITY)
Admission: RE | Admit: 2013-07-07 | Discharge: 2013-07-07 | Disposition: A | Discharge status: Home / Self Care | Payer: PRIVATE HEALTH INSURANCE | Source: Ambulatory Visit | Attending: Rheumatology | Admitting: Rheumatology

## 2013-07-07 ENCOUNTER — Other Ambulatory Visit (HOSPITAL_COMMUNITY): Payer: Self-pay | Admitting: Rheumatology

## 2013-07-07 DIAGNOSIS — Z87891 Personal history of nicotine dependence: Secondary | ICD-10-CM | POA: Insufficient documentation

## 2013-07-07 DIAGNOSIS — F17200 Nicotine dependence, unspecified, uncomplicated: Secondary | ICD-10-CM

## 2013-07-07 DIAGNOSIS — R059 Cough, unspecified: Secondary | ICD-10-CM | POA: Insufficient documentation

## 2013-07-07 DIAGNOSIS — R05 Cough: Secondary | ICD-10-CM | POA: Insufficient documentation

## 2013-07-07 IMAGING — CR DG HIP COMPLETE 2+V*R*
2 series · 2 of 2 positions shown · non-contrast
Comparison: Radiographs dated 03/22/2008

CLINICAL DATA: Right hip and groin pain radiating into the leg.

RIGHT HIP - COMPLETE 2+ VIEW

[view not recorded (1 of 2)]
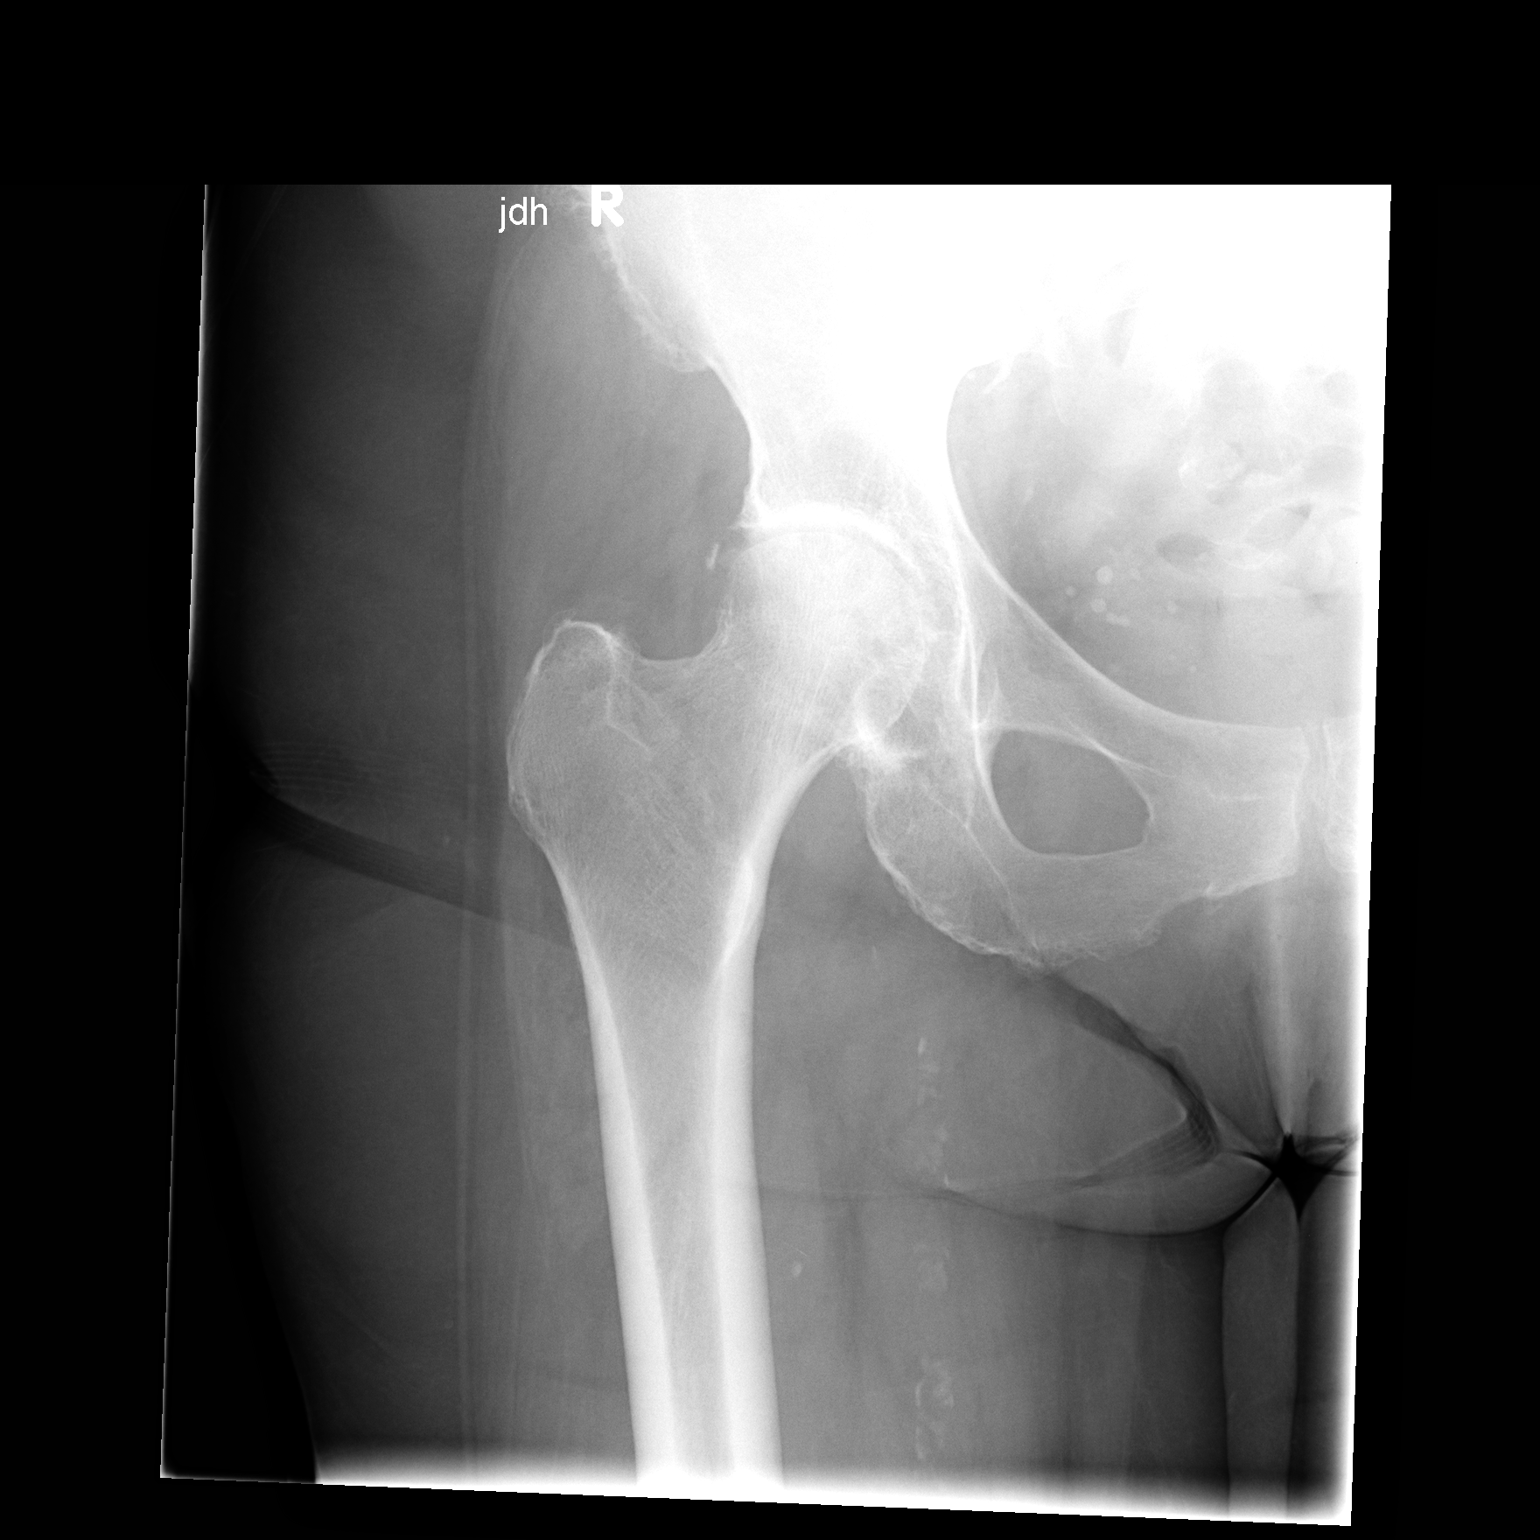

[view not recorded (2 of 2)]
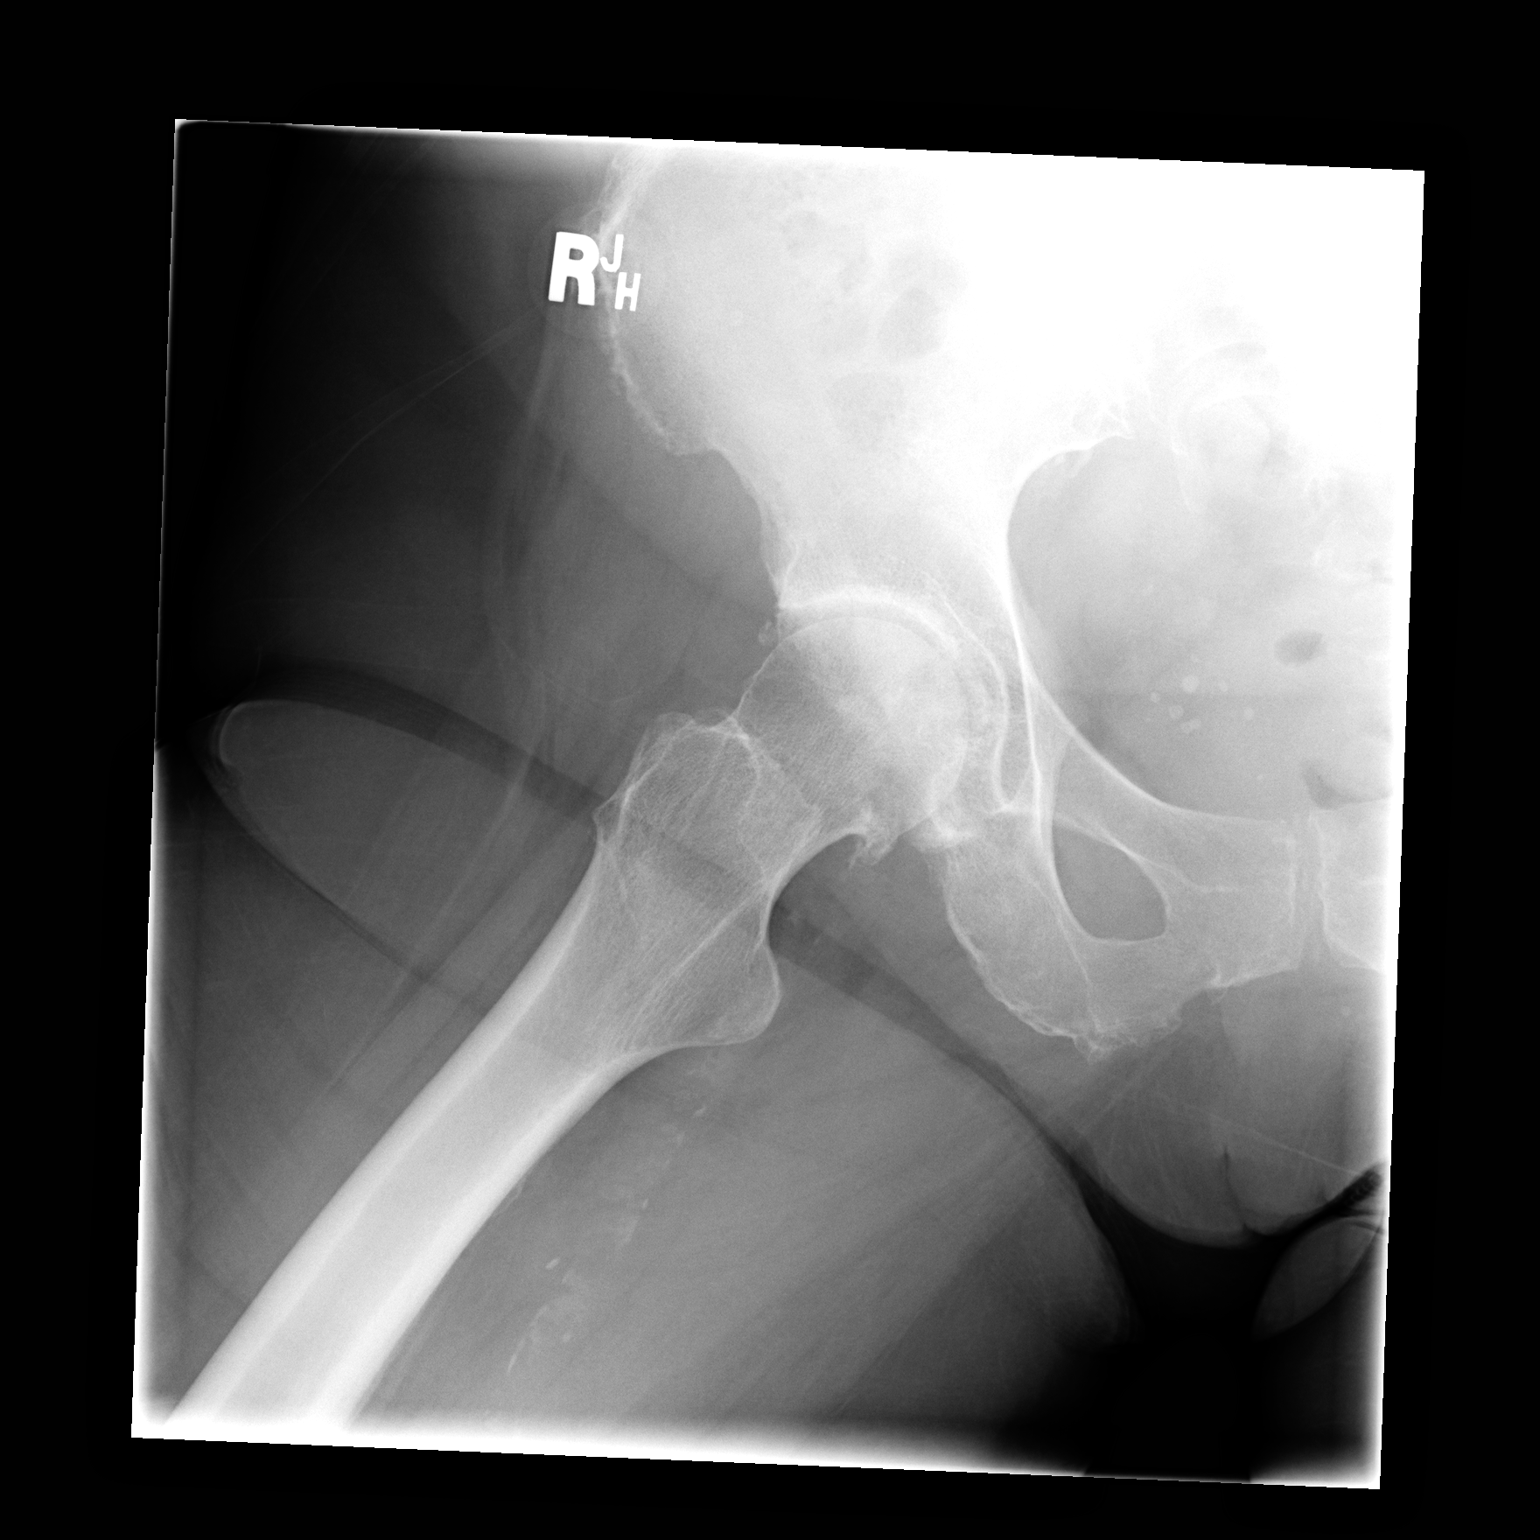

[2 of 2 positions shown; findings below may reference images not displayed]

FINDINGS: There is progressive osteoarthritis of the right hip with
increased joint space narrowing and increased osteophyte formation
of the humeral head.  There are no acute abnormalities.
IMPRESSION: Progressive osteoarthritis of the right hip.

## 2013-07-10 ENCOUNTER — Other Ambulatory Visit: Payer: Self-pay | Admitting: Orthopaedic Surgery

## 2013-07-10 ENCOUNTER — Other Ambulatory Visit: Payer: Self-pay | Admitting: Rheumatology

## 2013-07-10 DIAGNOSIS — M5416 Radiculopathy, lumbar region: Secondary | ICD-10-CM

## 2013-07-10 DIAGNOSIS — M25551 Pain in right hip: Secondary | ICD-10-CM

## 2013-07-13 ENCOUNTER — Telehealth: Payer: Self-pay | Admitting: Gastroenterology

## 2013-07-13 NOTE — Telephone Encounter (Signed)
Per the pt's daughter they just received a call about the pt's MRI results, from [facility] Dr. Bo Merino. She says they were told she has a mass in her hip area. The pts daughter wants to know if she should continue with the procedure tomorrow 07-14-13.

## 2013-07-13 NOTE — Telephone Encounter (Signed)
She has hematochezia, weight loss and a history of polyps so colonoscopy is still indicated. If they want to cancel she can call to reschedule. Will not charge a cancellation fee this time because of her diagnosis new hip diagnosis but will not waive the cancellation fee in the future.

## 2013-07-13 NOTE — Telephone Encounter (Signed)
Returned daughter's call and spoke to her in regards to her mom.  Daughter states that the patient does not want to proceed with the colonoscopy that is scheduled tomorrow due to the results of her MRI.  States that a mass was found on her hip and she would like to address this first.  I explained to daughter that a note will be sent to Dr. Fuller Plan so he will be aware and can review MRI results in EMR.  Appointment will be canceled.

## 2013-07-13 NOTE — Telephone Encounter (Signed)
Daughter notified.  She states that she has a mass on the hip and possibly the kidneys.  She wants to wait on endo colon.  They will call back to reschedule pending the appt with urology and other specialist

## 2013-07-14 ENCOUNTER — Encounter: Payer: PRIVATE HEALTH INSURANCE | Admitting: Gastroenterology

## 2013-07-16 ENCOUNTER — Telehealth: Payer: Self-pay | Admitting: Internal Medicine

## 2013-07-16 NOTE — Telephone Encounter (Signed)
Jennifer Ball and she wanted to change the patients appointment with Dr. Jenny Reichmann from 07/21/13 if at all possible to sooner.  I did check and we are completely booked.  [name] stated that would have to be ok to keep appointment as it is.

## 2013-07-16 NOTE — Telephone Encounter (Signed)
Patient sister, [name], called requesting to speak with Herbie Baltimore in regards to the patient's appointment with her cancer doctor, Dr. Karsten Ro. Please advise.

## 2013-07-17 ENCOUNTER — Other Ambulatory Visit: Payer: PRIVATE HEALTH INSURANCE

## 2013-07-21 ENCOUNTER — Ambulatory Visit (INDEPENDENT_AMBULATORY_CARE_PROVIDER_SITE_OTHER): Payer: Medicare Other | Admitting: Internal Medicine

## 2013-07-21 ENCOUNTER — Encounter: Payer: Self-pay | Admitting: Internal Medicine

## 2013-07-21 VITALS — BP 128/80 | HR 115 | Temp 99.2°F | Wt 161.4 lb

## 2013-07-21 DIAGNOSIS — R9389 Abnormal findings on diagnostic imaging of other specified body structures: Secondary | ICD-10-CM

## 2013-07-21 DIAGNOSIS — I10 Essential (primary) hypertension: Secondary | ICD-10-CM

## 2013-07-21 DIAGNOSIS — R799 Abnormal finding of blood chemistry, unspecified: Secondary | ICD-10-CM

## 2013-07-21 DIAGNOSIS — R778 Other specified abnormalities of plasma proteins: Secondary | ICD-10-CM | POA: Insufficient documentation

## 2013-07-21 DIAGNOSIS — R918 Other nonspecific abnormal finding of lung field: Secondary | ICD-10-CM

## 2013-07-21 MED ORDER — POTASSIUM CHLORIDE ER 10 MEQ PO TBCR: EXTENDED_RELEASE_TABLET | ORAL | Status: DC

## 2013-07-21 NOTE — Assessment & Plan Note (Signed)
Ok for IFE in f/u

## 2013-07-21 NOTE — Patient Instructions (Signed)
Please continue all other medications as before, and refills have been done if requested. Please have the pharmacy call with any other refills you may need.  Please go to the XRAY Department in the Basement (go straight as you get off the elevator) for the x-ray testing  Please go to the LAB in the Basement (turn left off the elevator) for the tests to be done today  You will be contacted by phone if any changes need to be made immediately.  Otherwise, you will receive a letter about your results with an explanation, but please check with MyChart first.  Please remember to sign up for MyChart if you have not done so, as this will be important to you in the future with finding out test results, communicating by private email, and scheduling acute appointments online when needed.

## 2013-07-21 NOTE — Assessment & Plan Note (Signed)
stable overall by history and exam, recent data reviewed with pt, and pt to continue medical treatment as before,  to f/u any worsening symptoms or concerns, also for bmet to r/o renal insufficiency

## 2013-07-21 NOTE — Progress Notes (Signed)
Pre visit review using our clinic review tool, if applicable. No additional management support is needed unless otherwise documented below in the visit note. 

## 2013-07-21 NOTE — Assessment & Plan Note (Signed)
With cough, o/w exam benign, ok for f/u cxr

## 2013-07-21 NOTE — Progress Notes (Signed)
Subjective:    Patient ID: Jennifer Ball, female    DOB: December 30, 1940, 73 y.o.   MRN: [id number]  HPI  Here to f/u with daughter, had recent right lower back spontaneous hematoma, but also right lumbar radiculopathy with pt unable to stand and walk, followed closely per orthopedic.  Daughter concerned about recent ? PNA on cxr, pt with mild nonprod cough, but no fever, Pt denies chest pain, increased sob or doe, wheezing, orthopnea, PND, increased LE swelling, palpitations, dizziness or syncope.  Daughter also mentions multiple times about some kind of renal abnormality found on last exam with rheumatology late April, and some upset I dont have the most recent exam notes or further details of this.  Pt without increased cr on mult labs recent.  Daughter quite animated, upset and pushing for better pain control as well, though pt is reluctant as afraid of CNS side effect with oxycodone 5 mg recent filled rx she brings with her today..  Daughter most upset about being advised of possible recurrent ca, then urology eval which tried to reassure them both that malignancy is not a current concern, such as metastatic bladder ca.  Most recnet lab I have per rheum with SPEP with "cant r/o faint band." Past Medical History  Diagnosis Date  . HYPOTHYROIDISM, POST-RADIATION 04/06/2008  . HYPERLIPIDEMIA 12/30/2006  . HYPERTENSION 12/30/2006  . Adenomatous colon polyp 06/1999  . OSTEOPOROSIS 12/30/2006  . History of nephrolithiasis 08/31/2011  . 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 Bladder tumor   . Heart murmur   . History of rheumatic fever CHILD  . Short of breath on exertion   . Rheumatoid arthritis(714.0) FOLLOWED DR TRUESLOW  . Smokers' cough   . Diabetes mellitus type 2, diet-controlled   . Bladder cancer 03/17/2012  . Hx of transfusion   . HOH (hard of hearing)     slightly  . GERD (gastroesophageal reflux disease) 01/07/2013   Past Surgical History  Procedure Laterality Date   . Total abdominal hysterectomy w/ bilateral salpingoophorectomy  1970's  . I-131 therapy  06/1999  . Nose surgery  1990    DISCOID LUPUS  . Transurethral resection of bladder tumor  12/24/2011    Procedure: TRANSURETHRAL RESECTION OF BLADDER TUMOR (TURBT);  Surgeon: Claybon Jabs, MD;  Location: [facility];  Service: Urology;  Laterality: N/A;  1 hour requested for this case  CAMERA  . Cystoscopy with biopsy  03/31/2012    Procedure: CYSTOSCOPY WITH BIOPSY;  Surgeon: Claybon Jabs, MD;  Location: [facility];  Service: Urology;  Laterality: N/A;  CYSTOSCOPY WITH BLADDER BIOPSY     reports that she has been smoking Cigarettes.  She has a 67.5 pack-year smoking history. She has never used smokeless tobacco. She reports that she does not drink alcohol or use illicit drugs. family history includes Pancreatic cancer in her mother. Allergies  Allergen Reactions  . Hydrocodone Nausea Only   Current Outpatient Prescriptions on File Prior to Visit  Medication Sig Dispense Refill  . aspirin 325 MG tablet Take 325 mg by mouth 2 (two) times daily.       Marland Kitchen levothyroxine (SYNTHROID, LEVOTHROID) 75 MCG tablet Take 1 tablet (75 mcg total) by mouth daily before breakfast.  30 tablet  11  . rosuvastatin (CRESTOR) 20 MG tablet Take 1 tablet (20 mg total) by mouth daily.  90 tablet  3  . triamcinolone cream (KENALOG) 0.1 % Apply topically 2 (  two) times daily.  30 g  0  . triamterene-hydrochlorothiazide (MAXZIDE-25) 37.5-25 MG per tablet take 1/2 tablet by mouth every MONDAY, WEDNESDAY AND FRIDAY  15 tablet  5   Current Facility-Administered Medications on File Prior to Visit  Medication Dose Route Frequency Provider Last Rate Last Dose  . mitomycin (MUTAMYCIN) chemo injection 40 mg  40 mg Bladder Instillation Once Claybon Jabs, MD       Review of Systems  Constitutional: Negative for unusual diaphoresis or other sweats  HENT: Negative for ringing in ear Eyes: Negative  for double vision or worsening visual disturbance.  Respiratory: Negative for choking and stridor.   Gastrointestinal: Negative for vomiting or other signifcant bowel change Genitourinary: Negative for hematuria or decreased urine volume.  Musculoskeletal: Negative for other MSK pain or swelling Skin: Negative for color change and worsening wound.  Neurological: Negative for tremors and numbness other than noted  Psychiatric/Behavioral: Negative for decreased concentration or agitation other than above       Objective:   Physical Exam BP 128/80  Pulse 115  Temp(Src) 99.2 F (37.3 C) (Oral)  Wt 161 lb 6 oz (73.199 kg)  SpO2 93% VS noted,  Constitutional: Pt appears well-developed, well-nourished.  HENT: Head: NCAT.  Right Ear: External ear normal.  Left Ear: External ear normal.  Eyes: . Pupils are equal, round, and reactive to light. Conjunctivae and EOM are normal Neck: Normal range of motion. Neck supple.  Cardiovascular: Normal rate and regular rhythm.   Pulmonary/Chest: Effort normal and breath sounds normal.  Abd:  Soft, NT, ND, + BS Neurological: Pt is alert. Not confused , o/w not done in detail Skin: Skin is warm. No rash Psychiatric: Pt behavior is normal. No agitation. mild nervous    Assessment & Plan:

## 2013-07-22 ENCOUNTER — Telehealth: Payer: Self-pay | Admitting: Internal Medicine

## 2013-07-22 ENCOUNTER — Other Ambulatory Visit (INDEPENDENT_AMBULATORY_CARE_PROVIDER_SITE_OTHER): Payer: PRIVATE HEALTH INSURANCE

## 2013-07-22 ENCOUNTER — Ambulatory Visit (INDEPENDENT_AMBULATORY_CARE_PROVIDER_SITE_OTHER)
Admission: RE | Admit: 2013-07-22 | Discharge: 2013-07-22 | Disposition: A | Discharge status: Home / Self Care | Payer: PRIVATE HEALTH INSURANCE | Source: Ambulatory Visit | Attending: Internal Medicine | Admitting: Internal Medicine

## 2013-07-22 DIAGNOSIS — R918 Other nonspecific abnormal finding of lung field: Secondary | ICD-10-CM

## 2013-07-22 DIAGNOSIS — R778 Other specified abnormalities of plasma proteins: Secondary | ICD-10-CM

## 2013-07-22 DIAGNOSIS — R799 Abnormal finding of blood chemistry, unspecified: Secondary | ICD-10-CM

## 2013-07-22 LAB — BASIC METABOLIC PANEL
BUN: 6 mg/dL (ref 6–23)
CHLORIDE: 99 meq/L (ref 96–112)
CO2: 26 mEq/L (ref 19–32)
Calcium: 9.4 mg/dL (ref 8.4–10.5)
Creatinine, Ser: 0.6 mg/dL (ref 0.4–1.2)
GFR: 119.06 mL/min (ref >60.00)
Glucose, Bld: 91 mg/dL (ref 70–99)
POTASSIUM: 3.3 meq/L — AB (ref 3.5–5.1)
Sodium: 133 mEq/L — ABNORMAL LOW (ref 135–145)

## 2013-07-22 NOTE — Telephone Encounter (Signed)
Called the patient informed of instructions.  Informed the daughter [name]) as well.

## 2013-07-22 NOTE — Telephone Encounter (Signed)
I Spoke to daughter by phone (with pt in the room with daughter) re: abnormal cxr and ? Left lung mass  Will plan for chest ct with contrast - done per emr  FYI to robin

## 2013-07-22 NOTE — Telephone Encounter (Signed)
error 

## 2013-07-23 ENCOUNTER — Ambulatory Visit (INDEPENDENT_AMBULATORY_CARE_PROVIDER_SITE_OTHER)
Admission: RE | Admit: 2013-07-23 | Discharge: 2013-07-23 | Disposition: A | Discharge status: Home / Self Care | Payer: PRIVATE HEALTH INSURANCE | Source: Ambulatory Visit | Attending: Internal Medicine | Admitting: Internal Medicine

## 2013-07-23 ENCOUNTER — Other Ambulatory Visit: Payer: Self-pay | Admitting: Internal Medicine

## 2013-07-23 DIAGNOSIS — R918 Other nonspecific abnormal finding of lung field: Secondary | ICD-10-CM

## 2013-07-23 DIAGNOSIS — R222 Localized swelling, mass and lump, trunk: Secondary | ICD-10-CM

## 2013-07-23 MED ORDER — IOHEXOL 300 MG/ML  SOLN
80.0000 mL | Freq: Once | INTRAMUSCULAR | Status: AC | PRN
  Administered 2013-07-23: 80 mL via INTRAVENOUS

## 2013-07-24 ENCOUNTER — Telehealth: Payer: Self-pay | Admitting: Internal Medicine

## 2013-07-24 LAB — IMMUNOFIXATION ELECTROPHORESIS
IGG (IMMUNOGLOBIN G), SERUM: 1490 mg/dL (ref 690–1700)
IGM, SERUM: 52 mg/dL (ref 52–322)
IgA: 675 mg/dL — ABNORMAL HIGH (ref 69–380)
TOTAL PROTEIN, SERUM ELECTROPHOR: 6.9 g/dL (ref 6.0–8.3)

## 2013-07-24 NOTE — Telephone Encounter (Signed)
Relevant patient education assigned to patient using Emmi. ° °

## 2013-07-28 ENCOUNTER — Other Ambulatory Visit (INDEPENDENT_AMBULATORY_CARE_PROVIDER_SITE_OTHER): Payer: Medicare Other

## 2013-07-28 ENCOUNTER — Encounter: Payer: Self-pay | Admitting: Internal Medicine

## 2013-07-28 ENCOUNTER — Ambulatory Visit (INDEPENDENT_AMBULATORY_CARE_PROVIDER_SITE_OTHER): Payer: Medicare Other | Admitting: Internal Medicine

## 2013-07-28 VITALS — BP 126/80 | HR 109 | Ht 66.0 in | Wt 158.2 lb

## 2013-07-28 DIAGNOSIS — K921 Melena: Secondary | ICD-10-CM

## 2013-07-28 DIAGNOSIS — M799 Soft tissue disorder, unspecified: Secondary | ICD-10-CM

## 2013-07-28 DIAGNOSIS — R222 Localized swelling, mass and lump, trunk: Secondary | ICD-10-CM

## 2013-07-28 DIAGNOSIS — M7989 Other specified soft tissue disorders: Secondary | ICD-10-CM

## 2013-07-28 DIAGNOSIS — R918 Other nonspecific abnormal finding of lung field: Secondary | ICD-10-CM

## 2013-07-28 LAB — CBC
HCT: 32.3 % — ABNORMAL LOW (ref 36.0–46.0)
Hemoglobin: 10.8 g/dL — ABNORMAL LOW (ref 12.0–15.0)
MCHC: 33.3 g/dL (ref 30.0–36.0)
MCV: 88 fl (ref 78.0–100.0)
Platelets: 305 10*3/uL (ref 150.0–400.0)
RBC: 3.67 Mil/uL — AB (ref 3.87–5.11)
RDW: 16.1 % — ABNORMAL HIGH (ref 11.5–15.5)
WBC: 6.5 10*3/uL (ref 4.0–10.5)

## 2013-07-28 LAB — PROTIME-INR
INR: 1.2 ratio — AB (ref 0.8–1.0)
Prothrombin Time: 13.4 s — ABNORMAL HIGH (ref 9.6–13.1)

## 2013-07-28 LAB — APTT: APTT: 33.3 s — AB (ref 21.7–28.8)

## 2013-07-28 NOTE — Progress Notes (Signed)
Subjective:    Patient ID: Jennifer Ball, female    DOB: 01/08/1941, 73 y.o.   MRN: [id number]  HPI PcP [name], MD Rheum: DR Bo Merino Rogelia Boga  IOV 07/28/2013  Chief Complaint  Patient presents with  . Pulmonary Consult    Referred by Dr. [name] for lung mass   Because of a lung mass. Referred by primary care physician Dr. [name].  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 She has chronic right hip pain for which he gets steroid injections. For the last 4 months these injections have not been helping the hip pain. Along with this she has new onset chronic low backache with associated lower extremity weakness and radicular leg pain. There is no associated bowel or bladder incontinence. Pain is reported as severe and only partially relieved by opioids. The pain is progressive. For the last 3 months has noticed a right sacral area swelling that has continued to enlarge in size. She has also been noticing left neck swelling and specifically a lump along with some nonspecific lumps in the bilateral thigh. Symptoms are associated with a 30 pound weight loss. A few weeks ago she then had one episode of hemoptysis. Other than this there is no dyspnea without a significant fatigue, declining functional status   The symptoms are resulted in an MRI of the lumbosacral spine and the hip 07/12/2013. The right sacral swelling is shown and on MRI is suspicious of a hematoma.  Findings also resulted in a CT scan of the chest 07/23/2013 that is very consistent with either stage IV non-small cell lung cancer or extensive stage small cell lung cancer. She does have supraclavicular left-sided lymphadenopathy along with hilar lymphadenopathy and a left upper lobe mass along with right adrenal mass and a small pericardial effusion.   Lung mass relevant hx  reports that she has been smoking Cigarettes.  She has a 90 pack-year smoking history. She has  never used smokeless tobacco.  IMPRESSION: CT chest 07/23/13 1. Spiculated mass medially in the left upper lobe. This is highly  concerning for malignancy.  2. Abundant left hilar and mediastinal adenopathy as well as left  supraclavicular adenopathy, consistent with metastasis.  3. Small pericardial effusion  4. Large right adrenal mass highly concerning for metastasis.  5. 11 mm liver lesion, etiology uncertain. Metastasis not excluded.  Electronically Signed  By: Skipper Cliche M.D.  On: 07/23/2013 14:10   Past Medical History  Diagnosis Date  . HYPOTHYROIDISM, POST-RADIATION 04/06/2008  . HYPERLIPIDEMIA 12/30/2006  . HYPERTENSION 12/30/2006  . Adenomatous colon polyp 06/1999  . OSTEOPOROSIS 12/30/2006  . History of nephrolithiasis 08/31/2011  . 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 Bladder tumor   . Heart murmur   . History of rheumatic fever CHILD  . Short of breath on exertion   . Rheumatoid arthritis(714.0) FOLLOWED DR TRUESLOW  . Smokers' cough   . Diabetes mellitus type 2, diet-controlled   . Bladder cancer 03/17/2012  . Hx of transfusion   . HOH (hard of hearing)     slightly  . GERD (gastroesophageal reflux disease) 01/07/2013     Family History  Problem Relation Age of Onset  . Pancreatic cancer Mother      History   Social History  . Marital Status: Single    Spouse Name: N/A    Number of Children: 2  . Years of Education: N/A   Occupational  History  . Retired    Social History Main Topics  . Smoking status: Current Every Day Smoker -- 2.00 packs/day for 45 years    Types: Cigarettes  . Smokeless tobacco: Never Used  . Alcohol Use: No  . Drug Use: No  . Sexual Activity: Not on file   Other Topics Concern  . Not on file   Social History Narrative   Married     Allergies  Allergen Reactions  . Hydrocodone Nausea Only     Outpatient Prescriptions Prior to Visit  Medication Sig Dispense Refill  . aspirin 325 MG  tablet Take 325 mg by mouth daily.       Marland Kitchen levothyroxine (SYNTHROID, LEVOTHROID) 75 MCG tablet Take 1 tablet (75 mcg total) by mouth daily before breakfast.  30 tablet  11  . potassium chloride (KLOR-CON 10) 10 MEQ tablet 1/2 tab every M-W-F only (not daily)  36 tablet  3  . rosuvastatin (CRESTOR) 20 MG tablet Take 1 tablet (20 mg total) by mouth daily.  90 tablet  3  . triamterene-hydrochlorothiazide (MAXZIDE-25) 37.5-25 MG per tablet take 1/2 tablet by mouth every MONDAY, WEDNESDAY AND FRIDAY  15 tablet  5  . triamcinolone cream (KENALOG) 0.1 % Apply topically 2 (two) times daily.  30 g  0   Facility-Administered Medications Prior to Visit  Medication Dose Route Frequency Provider Last Rate Last Dose  . mitomycin (MUTAMYCIN) chemo injection 40 mg  40 mg Bladder Instillation Once Claybon Jabs, MD        Current outpatient prescriptions:aspirin 325 MG tablet, Take 325 mg by mouth daily. , Disp: , Rfl: ;  HYDROcodone-acetaminophen (NORCO/VICODIN) 5-325 MG per tablet, Take 1 tablet by mouth as needed., Disp: , Rfl: ;  levothyroxine (SYNTHROID, LEVOTHROID) 75 MCG tablet, Take 1 tablet (75 mcg total) by mouth daily before breakfast., Disp: 30 tablet, Rfl: 11 potassium chloride (KLOR-CON 10) 10 MEQ tablet, 1/2 tab every M-W-F only (not daily), Disp: 36 tablet, Rfl: 3;  rosuvastatin (CRESTOR) 20 MG tablet, Take 1 tablet (20 mg total) by mouth daily., Disp: 90 tablet, Rfl: 3;  triamcinolone cream (KENALOG) 0.1 %, Apply topically as needed., Disp: , Rfl: ;  triamterene-hydrochlorothiazide (MAXZIDE-25) 37.5-25 MG per tablet, take 1/2 tablet by mouth every MONDAY, WEDNESDAY AND FRIDAY, Disp: 15 tablet, Rfl: 5 No current facility-administered medications for this visit. Facility-Administered Medications Ordered in Other Visits: mitomycin (MUTAMYCIN) chemo injection 40 mg, 40 mg, Bladder Instillation, Once, Claybon Jabs, MD    Review of Systems  Constitutional: Negative for fever and unexpected weight  change.  HENT: Positive for congestion. Negative for dental problem, ear pain, nosebleeds, postnasal drip, rhinorrhea, sinus pressure, sneezing, sore throat and trouble swallowing.   Eyes: Negative for redness and itching.  Respiratory: Positive for cough and chest tightness. Negative for shortness of breath and wheezing.   Cardiovascular: Negative for palpitations and leg swelling.  Gastrointestinal: Negative for nausea and vomiting.  Genitourinary: Negative for dysuria.  Musculoskeletal: Negative for joint swelling.  Skin: Negative for rash.  Neurological: Negative for headaches.  Hematological: Does not bruise/bleed easily.  Psychiatric/Behavioral: Negative for dysphoric mood. The patient is not nervous/anxious.        Objective:   Physical Exam  Vitals reviewed. Constitutional: She is oriented to person, place, and time. She appears well-developed and well-nourished. No distress.  Frail female sitting in wheel chair. "baby daughter" with her Body mass index is 25.55 kg/(m^2).   HENT:  Head: Normocephalic and atraumatic.  Right  Ear: External ear normal.  Left Ear: External ear normal.  Mouth/Throat: Oropharynx is clear and moist. No oropharyngeal exudate.  Left supraclavicular node + along with discrete mobile swelling above it that is mobile  Eyes: Conjunctivae and EOM are normal. Pupils are equal, round, and reactive to light. Right eye exhibits no discharge. Left eye exhibits no discharge. No scleral icterus.  Neck: Normal range of motion. Neck supple. No JVD present. No tracheal deviation present. No thyromegaly present.  Cardiovascular: Normal rate, regular rhythm, normal heart sounds and intact distal pulses.  Exam reveals no gallop and no friction rub.   No murmur heard. Pulmonary/Chest: Effort normal and breath sounds normal. No respiratory distress. She has no wheezes. She has no rales. She exhibits no tenderness.  Abdominal: Soft. Bowel sounds are normal. She exhibits no  distension and no mass. There is no tenderness. There is no rebound and no guarding.  Musculoskeletal: Normal range of motion. She exhibits no edema and no tenderness.  Lymphadenopathy:    She has no cervical adenopathy.  Neurological: She is alert and oriented to person, place, and time. She has normal reflexes. No cranial nerve deficit. She exhibits normal muscle tone. Coordination normal.  Able to get out of wheel chair and stand  RT SI area 5cm hard lump with definite edge  Skin: Skin is warm and dry. No rash noted. She is not diaphoretic. No erythema. No pallor.  Discoid lupus +  Psychiatric:  Poor historian    Filed Vitals:   07/28/13 0925  BP: 126/80  Pulse: 109  Height: 5\' 6"  (1.676 m)  Weight: 158 lb 3.2 oz (71.759 kg)  SpO2: 90%    Body mass index is 25.55 kg/(m^2).         Assessment & Plan:  Do blood work CBC ,PT and PTT Do PET scan ASAP next < 2 weeks Refer to INterventional Radiology at [facility] or [place] for biopsy of (d/w Dr Jacqulynn Cadet)  - left supraclavicular node and   - possibly biopsy of Rt gluteal/scaral swelling   - do biopsy < 2 weeks  FOllowup  - after biopsy

## 2013-07-28 NOTE — Patient Instructions (Signed)
Do blood work CBC ,PT and PTT Do PET scan ASAP next < 2 weeks Refer to INterventional Radiology at [facility] or [place] for biopsy of   - left supraclavicular node and   - possibly biopsy of Rt gluteal/scaral swelling   - do biopsy < 2 weeks  FOllowup  - after biopsy

## 2013-08-01 ENCOUNTER — Other Ambulatory Visit: Payer: Self-pay | Admitting: Internal Medicine

## 2013-08-03 DIAGNOSIS — M7989 Other specified soft tissue disorders: Secondary | ICD-10-CM | POA: Insufficient documentation

## 2013-08-03 DIAGNOSIS — R918 Other nonspecific abnormal finding of lung field: Secondary | ICD-10-CM | POA: Insufficient documentation

## 2013-08-03 NOTE — Assessment & Plan Note (Addendum)
Concerning for stage 4 lung nsclc or extensive stage small cell cancer  D./ w Dr Lewayne Bunting of IR;   PLAN  Do blood work CBC ,PT and PTT Do PET scan ASAP next < 2 weeks Refer to INterventional Radiology at [facility] or [place] for biopsy of   - left supraclavicular node and   - possibly biopsy of Rt gluteal/scaral swelling   - do biopsy < 2 weeks  FOllowup  - after biopsy  She verbalized understanding and is agreeable with plan

## 2013-08-10 ENCOUNTER — Ambulatory Visit (HOSPITAL_COMMUNITY)
Admission: RE | Admit: 2013-08-10 | Discharge: 2013-08-10 | Disposition: A | Discharge status: Home / Self Care | Payer: Medicare Other | Source: Ambulatory Visit | Attending: Internal Medicine | Admitting: Internal Medicine

## 2013-08-10 DIAGNOSIS — C771 Secondary and unspecified malignant neoplasm of intrathoracic lymph nodes: Secondary | ICD-10-CM | POA: Diagnosis not present

## 2013-08-10 DIAGNOSIS — C7970 Secondary malignant neoplasm of unspecified adrenal gland: Secondary | ICD-10-CM | POA: Diagnosis not present

## 2013-08-10 DIAGNOSIS — C3490 Malignant neoplasm of unspecified part of unspecified bronchus or lung: Secondary | ICD-10-CM | POA: Insufficient documentation

## 2013-08-10 DIAGNOSIS — E279 Disorder of adrenal gland, unspecified: Secondary | ICD-10-CM | POA: Diagnosis not present

## 2013-08-10 DIAGNOSIS — R918 Other nonspecific abnormal finding of lung field: Secondary | ICD-10-CM

## 2013-08-10 DIAGNOSIS — R222 Localized swelling, mass and lump, trunk: Secondary | ICD-10-CM | POA: Diagnosis present

## 2013-08-10 DIAGNOSIS — R599 Enlarged lymph nodes, unspecified: Secondary | ICD-10-CM | POA: Insufficient documentation

## 2013-08-10 LAB — GLUCOSE, CAPILLARY: Glucose-Capillary: 82 mg/dL (ref 70–99)

## 2013-08-10 MED ORDER — FLUDEOXYGLUCOSE F - 18 (FDG) INJECTION
11.1000 | Freq: Once | INTRAVENOUS | Status: AC | PRN
  Administered 2013-08-10: 11.1 via INTRAVENOUS

## 2013-08-12 ENCOUNTER — Telehealth: Payer: Self-pay | Admitting: Internal Medicine

## 2013-08-12 ENCOUNTER — Encounter (HOSPITAL_COMMUNITY): Payer: Self-pay | Admitting: Pharmacy Technician

## 2013-08-12 NOTE — Telephone Encounter (Signed)
Let Jennifer Ball  Or familyu member know that 08/10/13 PET Scan is abnormal. SHe is having bx 08/17/13. Please give her followup to see me or Tammy to discuss results 08/20/13 onwards  Thanks  Dr. Brand Males, M.D., [facility].C.P Pulmonary and Critical Care Medicine Staff Physician [place] [facility] Pager: [phone], If no answer or between  15:00h - 7:00h: call [phone]  08/12/2013 2:18 PM

## 2013-08-12 NOTE — Telephone Encounter (Signed)
Called and spoke to daughter, [name]. Informed [name] of biopsy date (08/17/2013) and she was aware. [name] preferred to see MR after biopsy at a later date opposed to seeing TP sooner. Appt was schedule for 08/31/13 at 2:15 with MR. MR please advise if appt date is ok being further out.

## 2013-08-12 NOTE — Telephone Encounter (Signed)
That is too long a wait from 08/17/13 to 08/31/13 to get bx result   I d/w daughter [name] who says she prefers to wait to see me on 08/31/13 but in interim ok to give potential cancer diagnosis over phone. PEr daughter: patient has been very clear that she refused chemo, xrt and prefers symptom based approach. So, daugther wants to discuss palliative approach only on 08/31/13 visit. Does nto even want potential onc referral  Closing message. FYI for you Daneil Dan. No need to reply   Dr. Brand Males, M.D., [facility].C.P Pulmonary and Critical Care Medicine Staff Physician [place] [facility] Pager: [phone], If no answer or between  15:00h - 7:00h: call [phone]  08/12/2013 3:29 PM

## 2013-08-13 ENCOUNTER — Other Ambulatory Visit: Payer: Self-pay | Admitting: Radiology

## 2013-08-17 ENCOUNTER — Ambulatory Visit (HOSPITAL_COMMUNITY)
Admission: RE | Admit: 2013-08-17 | Discharge: 2013-08-17 | Disposition: A | Discharge status: Home / Self Care | Payer: Medicare Other | Source: Ambulatory Visit | Attending: Internal Medicine | Admitting: Internal Medicine

## 2013-08-17 ENCOUNTER — Encounter (HOSPITAL_COMMUNITY): Payer: Self-pay

## 2013-08-17 DIAGNOSIS — M799 Soft tissue disorder, unspecified: Secondary | ICD-10-CM | POA: Insufficient documentation

## 2013-08-17 DIAGNOSIS — Z79899 Other long term (current) drug therapy: Secondary | ICD-10-CM | POA: Insufficient documentation

## 2013-08-17 DIAGNOSIS — C50919 Malignant neoplasm of unspecified site of unspecified female breast: Secondary | ICD-10-CM | POA: Insufficient documentation

## 2013-08-17 DIAGNOSIS — C679 Malignant neoplasm of bladder, unspecified: Secondary | ICD-10-CM | POA: Insufficient documentation

## 2013-08-17 DIAGNOSIS — M7989 Other specified soft tissue disorders: Secondary | ICD-10-CM

## 2013-08-17 DIAGNOSIS — C779 Secondary and unspecified malignant neoplasm of lymph node, unspecified: Secondary | ICD-10-CM | POA: Insufficient documentation

## 2013-08-17 DIAGNOSIS — Z860100 Personal history of colon polyps, unspecified: Secondary | ICD-10-CM | POA: Insufficient documentation

## 2013-08-17 DIAGNOSIS — E785 Hyperlipidemia, unspecified: Secondary | ICD-10-CM | POA: Insufficient documentation

## 2013-08-17 DIAGNOSIS — I10 Essential (primary) hypertension: Secondary | ICD-10-CM | POA: Insufficient documentation

## 2013-08-17 DIAGNOSIS — F17200 Nicotine dependence, unspecified, uncomplicated: Secondary | ICD-10-CM | POA: Insufficient documentation

## 2013-08-17 DIAGNOSIS — Z7982 Long term (current) use of aspirin: Secondary | ICD-10-CM | POA: Insufficient documentation

## 2013-08-17 DIAGNOSIS — R599 Enlarged lymph nodes, unspecified: Secondary | ICD-10-CM | POA: Insufficient documentation

## 2013-08-17 DIAGNOSIS — K219 Gastro-esophageal reflux disease without esophagitis: Secondary | ICD-10-CM | POA: Insufficient documentation

## 2013-08-17 DIAGNOSIS — M069 Rheumatoid arthritis, unspecified: Secondary | ICD-10-CM | POA: Insufficient documentation

## 2013-08-17 DIAGNOSIS — R222 Localized swelling, mass and lump, trunk: Secondary | ICD-10-CM | POA: Insufficient documentation

## 2013-08-17 DIAGNOSIS — E119 Type 2 diabetes mellitus without complications: Secondary | ICD-10-CM | POA: Insufficient documentation

## 2013-08-17 LAB — CBC
HEMATOCRIT: 32.8 % — AB (ref 36.0–46.0)
Hemoglobin: 10.9 g/dL — ABNORMAL LOW (ref 12.0–15.0)
MCH: 28.3 pg (ref 26.0–34.0)
MCHC: 33.2 g/dL (ref 30.0–36.0)
MCV: 85.2 fL (ref 78.0–100.0)
Platelets: 268 10*3/uL (ref 150–400)
RBC: 3.85 MIL/uL — ABNORMAL LOW (ref 3.87–5.11)
RDW: 16.2 % — AB (ref 11.5–15.5)
WBC: 6.9 10*3/uL (ref 4.0–10.5)

## 2013-08-17 LAB — PROTIME-INR
INR: 1.06 (ref 0.00–1.49)
Prothrombin Time: 13.6 seconds (ref 11.6–15.2)

## 2013-08-17 LAB — APTT: APTT: 41 s — AB (ref 24–37)

## 2013-08-17 MED ORDER — FENTANYL CITRATE 0.05 MG/ML IJ SOLN
INTRAMUSCULAR | Status: AC | PRN
Start: 2013-08-17 — End: 2013-08-17
  Administered 2013-08-17 (×3): 25 ug via INTRAVENOUS

## 2013-08-17 MED ORDER — SODIUM CHLORIDE 0.9 % IV SOLN
INTRAVENOUS | Status: DC
  Administered 2013-08-17: 12:00:00 via INTRAVENOUS

## 2013-08-17 MED ORDER — MIDAZOLAM HCL 2 MG/2ML IJ SOLN
INTRAMUSCULAR | Status: AC
  Filled 2013-08-17: qty 6

## 2013-08-17 MED ORDER — FENTANYL CITRATE 0.05 MG/ML IJ SOLN
INTRAMUSCULAR | Status: AC
  Filled 2013-08-17: qty 6

## 2013-08-17 MED ORDER — MIDAZOLAM HCL 2 MG/2ML IJ SOLN
INTRAMUSCULAR | Status: AC | PRN
  Administered 2013-08-17 (×3): 0.5 mg via INTRAVENOUS
  Administered 2013-08-17: 1 mg via INTRAVENOUS

## 2013-08-17 NOTE — H&P (Signed)
Jennifer Ball is an 73 y.o. female.   Chief Complaint: "I'm getting a biopsy" HPI: Patient with history of bladder carcinoma and recent PET revealing hypermetabolic left lung mass with associated hilar/mediastinal/supraclavicular/cervical lymphadenopathy, right adrenal gland/left hamstring and right lower back SQ lesions. She presents today for US guided biopsy of a left supraclavicular lymph node and rt lower back SQ mass.  Past Medical History  Diagnosis Date  . HYPOTHYROIDISM, POST-RADIATION 04/06/2008  . HYPERLIPIDEMIA 12/30/2006  . HYPERTENSION 12/30/2006  . Adenomatous colon polyp 06/1999  . OSTEOPOROSIS 12/30/2006  . History of nephrolithiasis 08/31/2011  . 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 Bladder tumor   . Heart murmur   . History of rheumatic fever CHILD  . Short of breath on exertion   . Rheumatoid arthritis(714.0) FOLLOWED DR TRUESLOW  . Smokers' cough   . Diabetes mellitus type 2, diet-controlled   . Bladder cancer 03/17/2012  . Hx of transfusion   . HOH (hard of hearing)     slightly  . GERD (gastroesophageal reflux disease) 01/07/2013    Past Surgical History  Procedure Laterality Date  . Total abdominal hysterectomy w/ bilateral salpingoophorectomy  1970's  . I-131 therapy  06/1999  . Nose surgery  1990    DISCOID LUPUS  . Transurethral resection of bladder tumor  12/24/2011    Procedure: TRANSURETHRAL RESECTION OF BLADDER TUMOR (TURBT);  Surgeon: Claybon Jabs, MD;  Location: [facility];  Service: Urology;  Laterality: N/A;  1 hour requested for this case  CAMERA  . Cystoscopy with biopsy  03/31/2012    Procedure: CYSTOSCOPY WITH BIOPSY;  Surgeon: Claybon Jabs, MD;  Location: [facility];  Service: Urology;  Laterality: N/A;  CYSTOSCOPY WITH BLADDER BIOPSY     Family History  Problem Relation Age of Onset  . Pancreatic cancer Mother    Social History:  reports that she has been smoking Cigarettes.   She has a 90 pack-year smoking history. She has never used smokeless tobacco. She reports that she does not drink alcohol or use illicit drugs.  Allergies:  Allergies  Allergen Reactions  . Hydrocodone Nausea Only    Current outpatient prescriptions:aspirin 325 MG tablet, Take 325 mg by mouth every morning. , Disp: , Rfl: ;  levothyroxine (SYNTHROID, LEVOTHROID) 75 MCG tablet, take 1 tablet by mouth once daily BEFORE BREAKFAST, Disp: 30 tablet, Rfl: 11;  potassium chloride SA (K-DUR,KLOR-CON) 20 MEQ tablet, Take 20 mEq by mouth every Monday, Wednesday, and Friday., Disp: , Rfl:  rosuvastatin (CRESTOR) 20 MG tablet, Take 20 mg by mouth every morning., Disp: , Rfl: ;  triamcinolone cream (KENALOG) 0.1 %, Apply 1 application topically 2 (two) times daily as needed (rash). , Disp: , Rfl: ;  triamterene-hydrochlorothiazide (MAXZIDE-25) 37.5-25 MG per tablet, Take 0.5 tablets by mouth every Monday, Wednesday, and Friday., Disp: , Rfl:  Current facility-administered medications:0.9 %  sodium chloride infusion, , Intravenous, Continuous, D Kevin Allred, PA-C, Last Rate: 50 mL/hr at 08/17/13 1136;  fentaNYL (SUBLIMAZE) 0.05 MG/ML injection, , , , ;  midazolam (VERSED) 2 MG/2ML injection, , , ,  Facility-Administered Medications Ordered in Other Encounters: mitomycin (MUTAMYCIN) chemo injection 40 mg, 40 mg, Bladder Instillation, Once, Claybon Jabs, MD   Results for orders placed during the hospital encounter of 08/17/13 (from the past 48 hour(s))  APTT     Status: Abnormal   Collection Time    08/17/13 11:35 AM  Result Value Ref Range   aPTT 41 (*) 24 - 37 seconds   Comment:            IF BASELINE aPTT IS ELEVATED,     SUGGEST PATIENT RISK ASSESSMENT     BE USED TO DETERMINE APPROPRIATE     ANTICOAGULANT THERAPY.  CBC     Status: Abnormal   Collection Time    08/17/13 11:35 AM      Result Value Ref Range   WBC 6.9  4.0 - 10.5 K/uL   RBC 3.85 (*) 3.87 - 5.11 MIL/uL   Hemoglobin 10.9 (*)  12.0 - 15.0 g/dL   HCT 32.8 (*) 36.0 - 46.0 %   MCV 85.2  78.0 - 100.0 fL   MCH 28.3  26.0 - 34.0 pg   MCHC 33.2  30.0 - 36.0 g/dL   RDW 16.2 (*) 11.5 - 15.5 %   Platelets 268  150 - 400 K/uL  PROTIME-INR     Status: None   Collection Time    08/17/13 11:35 AM      Result Value Ref Range   Prothrombin Time 13.6  11.6 - 15.2 seconds   INR 1.06  0.00 - 1.49   No results found.  Review of Systems  Constitutional: Positive for weight loss and malaise/fatigue.       Occ fever  Cardiovascular:       Occ chest discomfort, cough with small amt hemoptysis, and dyspnea  Gastrointestinal: Positive for nausea and abdominal pain. Negative for vomiting.  Genitourinary: Positive for flank pain. Negative for hematuria.  Musculoskeletal: Positive for back pain and neck pain.       Left leg pain secondary to hamstring mass  Neurological: Positive for headaches.    Blood pressure 93/52, pulse 99, temperature 98.3 F (36.8 C), temperature source Oral, resp. rate 18, SpO2 99.00%. Physical Exam  Constitutional: She is oriented to person, place, and time.  Frail BF   Neck:  Left supraclavicular adenopathy  Cardiovascular: Normal rate and regular rhythm.   Respiratory: Effort normal and breath sounds normal.  GI: Soft. Bowel sounds are normal.  Mild generalized tenderness; tender ST mass rt lower back  Musculoskeletal: She exhibits no edema.  Firm ? Nodal  mass left hamstring region  Neurological: She is alert and oriented to person, place, and time.     Assessment/Plan Patient with history of bladder carcinoma and recent PET revealing hypermetabolic left lung mass with associated hilar/mediastinal/supraclavicular/cervical lymphadenopathy, right adrenal gland/left hamstring and right lower back SQ lesions. She presents today for US guided biopsy of a left supraclavicular lymph node and rt lower back SQ mass. Details/risks of procedure d/w pt/daughter with their understanding and consent.  Crissie Sickles Allred 08/17/2013, 12:47 PM

## 2013-08-17 NOTE — Discharge Instructions (Signed)
Needle Biopsy Care After These instructions give you information on caring for yourself after your procedure. Your doctor may also give you more specific instructions. Call your doctor if you have any problems or questions after your procedure. HOME CARE  Rest for 4 hours after your biopsy, except for getting up to go to the bathroom or as told.  Keep the places where the needles were put in clean and dry.  Do not put powder or lotion on the sites.  Do not shower until 24 hours after the test. Remove all bandages (dressings) before showering.  Remove all bandages at least once every day. Gently clean the sites with soap and water. Keep putting a new bandage on until the skin is closed. Finding out the results of your test Ask your doctor when your test results will be ready. Make sure you follow up and get the test results. GET HELP RIGHT AWAY IF:   You have shortness of breath or trouble breathing.  You have pain or cramping in your belly (abdomen).  You feel sick to your stomach (nauseous) or throw up (vomit).  Any of the places where the needles were put in:  Are puffy (swollen) or red.  Are sore or hot to the touch.  Are draining yellowish-white fluid (pus).  Are bleeding after 10 minutes of pressing down on the site. Have someone keep pressing on any place that is bleeding until you see a doctor.  You have any unusual pain that will not stop.  You have a fever. If you go to the emergency room, tell the nurse that you had a biopsy. Take this paper with you to show the nurse. MAKE SURE YOU:   Understand these instructions.  Will watch your condition.  Will get help right away if you are not doing well or get worse. Document Released: 02/09/2008 Document Revised: 05/21/2011 Document Reviewed: 02/09/2008 [facility] Patient Information 2014 [place]. Moderate Sedation, Adult Moderate sedation is given to help you relax or even sleep through a procedure. You may  remain sleepy, be clumsy, or have poor balance for several hours following this procedure. Arrange for a responsible adult, family member, or friend to take you home. A responsible adult should stay with you for at least 24 hours or until the medicines have worn off.  Do not participate in any activities where you could become injured for the next 24 hours, or until you feel normal again. Do not:  Drive.  Swim.  Ride a bicycle.  Operate heavy machinery.  Cook.  Use power tools.  Climb ladders.  Work at [employer].  Do not make important decisions or sign legal documents until you are improved.  Vomiting may occur if you eat too soon. When you can drink without vomiting, try water, juice, or soup. Try solid foods if you feel little or no nausea.  Only take over-the-counter or prescription medications for pain, discomfort, or fever as directed by your caregiver.If pain medications have been prescribed for you, ask your caregiver how soon it is safe to take them.  Make sure you and your family fully understands everything about the medication given to you. Make sure you understand what side effects may occur.  You should not drink alcohol, take sleeping pills, or medications that cause drowsiness for at least 24 hours.  If you smoke, do not smoke alone.  If you are feeling better, you may resume normal activities 24 hours after receiving sedation.  Keep all appointments as scheduled.  Follow all instructions.  Ask questions if you do not understand. SEEK MEDICAL CARE IF:   Your skin is pale or bluish in color.  You continue to feel sick to your stomach (nauseous) or throw up (vomit).  Your pain is getting worse and not helped by medication.  You have bleeding or swelling.  You are still sleepy or feeling clumsy after 24 hours. SEEK IMMEDIATE MEDICAL CARE IF:   You develop a rash.  You have difficulty breathing.  You develop any type of allergic problem.  You have a  fever. Document Released: 11/21/2000 Document Revised: 05/21/2011 Document Reviewed: 11/03/2012 [facility] Patient Information 2014 [place].

## 2013-08-17 NOTE — Procedures (Signed)
Interventional Radiology Procedure Note  Procedure: 1.) [place] core bx LEFT supraclavicular LN 2.) [place] core bx RIGHT low back soft tissue mass Complications: None Recommendations: - Bedrest x 1 hr - Path pending  Signed,  Criselda Peaches, MD [facility] Radiology

## 2013-08-19 ENCOUNTER — Telehealth: Payer: Self-pay | Admitting: Internal Medicine

## 2013-08-19 NOTE — Telephone Encounter (Signed)
Spoke with Emergency Contact caller Jennifer Ball)- she is aware that MR has been out of the office and has not been able to sign off on results as of today. She is aware that I am sending message to MR to advise on results asap.

## 2013-08-19 NOTE — Telephone Encounter (Signed)
See prior phone note: Daughter [name] was 100% ok receiving cancer dx over phone. She declined to come sooner just to get diagnosis. Patient does not want onc referral. THey see me 08/31/13. IF you feel comfortable you can tell daughter [name] that patient has advanced cancer on biopsy but no further specifications. IF you are not comfortable, send it back to me and I will call maybe at 6 to  7am tomorrow or when I can this week  Dr. Brand Males, M.D., [facility].C.P Pulmonary and Critical Care Medicine Staff Physician [place] [facility] Pager: [phone], If no answer or between  15:00h - 7:00h: call [phone]  08/19/2013 3:20 PM

## 2013-08-20 NOTE — Telephone Encounter (Signed)
Pt's daughter has called back. She is in a lot of pain & not eating and they want to know what to do.

## 2013-08-20 NOTE — Telephone Encounter (Signed)
Pt's daughter - Jennifer Ball - called back regarding her biopsy.  She states she is not eating and wants to know what to do. Ph # [phone]

## 2013-08-20 NOTE — Telephone Encounter (Signed)
I called spoke with Dr. Chase Caller. He will call her later tonight Daughter is aware.

## 2013-08-20 NOTE — Telephone Encounter (Signed)
Per Joellen Jersey MR was going to call them this AM. Will forward to MR for documentation

## 2013-08-20 NOTE — Telephone Encounter (Signed)
MR pts daughter is calling back about this pt.

## 2013-08-21 NOTE — Telephone Encounter (Addendum)
I called daughter [name] at 23.30 last night 08/20/13 and had to leave message to call back to elink before 7am 08/21/2013  I again called 7:03 AM 08/21/2013 and again went to her voice mail on [phone].  Triage:  If she calls back a) you can confirm metastatic cancer - she knows she is prepared. B) for patient symptoms and pain: I recommend hospice.  Hospice doc c) I will ask [name], MD patient PCP to see if he can call patient with results because I will only return 08/24/13  Dr Jenny Reichmann This patient Jennifer Ball had bipsy which confirms metastatic cancer nos. Daughter did not want to bring her in to get the news but was okay with news over phone. Unfortunately we have been playing phone tag last few days due to my night shift. Would it be possible for you to break news over phone; she is anticipating cancer dx. The bigger issue for daughter is rapid declining patient status: I recommend hospice and perhaps you can recommend   I am only back Monday 08/24/13 .   Thanks  Dr. Brand Males, M.D., [facility].C.P Pulmonary and Critical Care Medicine Staff Physician [place] Pulmonary and Critical Care Pager: [phone], If no answer or between  15:00h - 7:00h: call [phone]  08/21/2013 7:05 AM

## 2013-08-21 NOTE — Telephone Encounter (Signed)
Spoke with daughter. Pt is scheduled to come in and see MR Monday 08/24/13 at 4:15 to see MR. Nothing further needed

## 2013-08-21 NOTE — Telephone Encounter (Signed)
Pt's daughter would like to set up appt with MR for early next week to go over all these issues, so if none are available he'll need to call her   I told her daughter it was met ca likely urologic origin and assured she didn't need anything for weekend.

## 2013-08-21 NOTE — Telephone Encounter (Signed)
Called spoke with daughter. Relayed message as MR stated below. She would like to speak w/ our DOD bc she has a lot of questions and MR is off.  1) how advanced is her cancer? 2) time frame that pt has left? 3) where all is the cancer located at? 4) Did something show in BX reason bc can't eat solid foods any longer? She wants to know if we can order pt a wheel chair, bathing chair, walker? Would like to discuss the hospice referral once she speak w/ DOD Will forward to Dr. Melvyn Novas to see if he can call and speak with her personally. She can be reached at [phone] Please advise thanks

## 2013-08-24 ENCOUNTER — Ambulatory Visit (INDEPENDENT_AMBULATORY_CARE_PROVIDER_SITE_OTHER): Payer: Medicare Other | Admitting: Internal Medicine

## 2013-08-24 ENCOUNTER — Encounter: Payer: Self-pay | Admitting: Internal Medicine

## 2013-08-24 VITALS — BP 98/58 | HR 113 | Ht 66.0 in

## 2013-08-24 DIAGNOSIS — C3490 Malignant neoplasm of unspecified part of unspecified bronchus or lung: Secondary | ICD-10-CM | POA: Insufficient documentation

## 2013-08-24 DIAGNOSIS — C7910 Secondary malignant neoplasm of unspecified urinary organs: Secondary | ICD-10-CM | POA: Insufficient documentation

## 2013-08-24 DIAGNOSIS — C7919 Secondary malignant neoplasm of other urinary organs: Secondary | ICD-10-CM

## 2013-08-24 NOTE — Patient Instructions (Addendum)
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

## 2013-08-24 NOTE — Progress Notes (Signed)
Subjective:    Patient ID: Jennifer Ball, female    DOB: [birth year], 73 y.o.   MRN: [id number]  HPI  PcP [name], MD Rheum: DR Bo Merino Ortho Dr Lorin Mercy  [name] 07/28/2013  Chief Complaint  Patient presents with  . Pulmonary Consult    Referred by Dr. [name] for lung mass   Because of a lung mass. Referred by primary care physician Dr. [name].  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 She has chronic right hip pain for which he gets steroid injections. For the last 4 months these injections have not been helping the hip pain. Along with this she has new onset chronic low backache with associated lower extremity weakness and radicular leg pain. There is no associated bowel or bladder incontinence. Pain is reported as severe and only partially relieved by opioids. The pain is progressive. For the last 3 months has noticed a right sacral area swelling that has continued to enlarge in size. She has also been noticing left neck swelling and specifically a lump along with some nonspecific lumps in the bilateral thigh. Symptoms are associated with a 30 pound weight loss. A few weeks ago she then had one episode of hemoptysis. Other than this there is no dyspnea without a significant fatigue, declining functional status   The symptoms are resulted in an MRI of the lumbosacral spine and the hip 07/12/2013. The right sacral swelling is shown and on MRI is suspicious of a hematoma.  Findings also resulted in a CT scan of the chest 07/23/2013 that is very consistent with either stage IV non-small cell lung cancer or extensive stage small cell lung cancer. She does have supraclavicular left-sided lymphadenopathy along with hilar lymphadenopathy and a left upper lobe mass along with right adrenal mass and a small pericardial effusion.   Lung mass relevant hx  reports that she has been smoking Cigarettes.  She has a 90 pack-year smoking history. She  has never used smokeless tobacco.  IMPRESSION: CT chest 07/23/13 1. Spiculated mass medially in the left upper lobe. This is highly  concerning for malignancy.  2. Abundant left hilar and mediastinal adenopathy as well as left  supraclavicular adenopathy, consistent with metastasis.  3. Small pericardial effusion  4. Large right adrenal mass highly concerning for metastasis.  5. 11 mm liver lesion, etiology uncertain. Metastasis not excluded.  Electronically Signed  By: Skipper Cliche M.D.  On: 07/23/2013 14:10  REC Do blood work CBC ,PT and PTT Do PET scan ASAP next < 2 weeks Refer to INterventional Radiology at [facility] or [place] for biopsy of   - left supraclavicular node and   - possibly biopsy of Rt gluteal/scaral swelling   - do biopsy < 2 weeks  FOllowup  - after biopsy   OV 08/24/2013  Chief Complaint  Patient presents with  . Follow-up    Pt states she is more short of breath since last OV. Pt c/o right lateral rib pain when lifting arm and rolling in bed. Pt c/o hemoptysis in morning of about 1 tsp and then pink tinged mucous throughout day when coughing. Pt c/o palpitations at times. Pt states at times she takes up to four 325mg  asa per day.    Here to discuss results   PET scan 08/10/13 suggest metastatic stage IV lung cancer IMPRESSION:  1. Findings consistent with metastatic stage IV lung cancer.  2. The left upper lobe perihilar lung mass is  intensely  hypermetabolic compatible with primary bronchogenic carcinoma. There  is if see lateral hilar, mediastinal, supraclavicular, and cervical  lymph node metastasis present. There is also metastasis to the right  adrenal gland, left hamstring, and subcutaneous fat overlying the  right lower back.  Electronically Signed  By: Kerby Moors M.D.  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  She and her daughter here.  She is very categorical in the presence of her daughter that she wishes to be DO NOT RESUSCITATE, palliate symptoms. She does not want chemotherapy or radiation therapy. The mom is into hospice she wants hospice referral. She is in significant pain. Her ECoG is currently 4. She is mostly in wheelchair. She does not have enough strength to get up. She does not have any incontinence. Her appetite is poor.  She continues to have some hemoptysis for the last several months. This is slowly getting worse. Particularly worse during the morning. It is around 1 teaspoon a day. I've explained to her that massive hemoptysis and result in her death in this situation she would need to go to the emergency room to have embolization. She is very categorical she does not want to go to the emergency room but rather she would die peacefully after massive hemoptysis at home. She does not want any admissions to the hospital  Review of Systems  Constitutional: Negative for fever and unexpected weight change.  HENT: Positive for congestion. Negative for dental problem, ear pain, nosebleeds, postnasal drip, rhinorrhea, sinus pressure, sneezing, sore throat and trouble swallowing.   Eyes: Negative for redness and itching.  Respiratory: Positive for cough and shortness of breath. Negative for chest tightness and wheezing.   Cardiovascular: Positive for palpitations. Negative for leg swelling.  Gastrointestinal: Negative for nausea and vomiting.  Genitourinary: Negative for dysuria.  Musculoskeletal: Negative for joint swelling.  Skin: Negative for rash.  Neurological: Negative for headaches.  Hematological: Does not bruise/bleed easily.  Psychiatric/Behavioral: Negative for dysphoric mood. The patient is not nervous/anxious.        Objective:   Physical Exam  Filed Vitals:   08/24/13 1641  BP: 98/58  Pulse: 113  Height: 5\' 6"  (1.676 m)  SpO2: 97%    Discussion only visit      Assessment & Plan:  More  likely advanced bladdder cancer; sTage 4 lung cancer  Less likely Respect your decision for hospice Refer hospice

## 2013-08-25 ENCOUNTER — Telehealth: Payer: Self-pay | Admitting: Internal Medicine

## 2013-08-25 NOTE — Telephone Encounter (Signed)
Spoke with Juliann Pulse. Aware of below. Nothing further needed

## 2013-08-25 NOTE — Telephone Encounter (Signed)
Please advise MR thanks

## 2013-08-25 NOTE — Telephone Encounter (Signed)
Yes for me as hospice attending and yes for hospic doc to manage symptoms

## 2013-08-27 ENCOUNTER — Telehealth: Payer: Self-pay | Admitting: Internal Medicine

## 2013-08-27 MED ORDER — SENNA 8.6 MG PO TABS: ORAL_TABLET | ORAL | Status: AC

## 2013-08-27 NOTE — Telephone Encounter (Signed)
Called and spoke to [facility] with Hospice. Pt was admitted to Hospice. Pam was requesting Senakot 1-4 tab BID prn for [employer]. MR please advise if that's ok.   Pharm: [facility], [address]

## 2013-08-27 NOTE — Telephone Encounter (Signed)
Spoke with Pam with Hospice and informed that rx for Senokot was sent to pharmacy.

## 2013-08-27 NOTE — Telephone Encounter (Signed)
That is fine  Dr. Brand Males, M.D., [facility].C.P Pulmonary and Critical Care Medicine Staff Physician [place] [facility] Critical [facility] Pager: [phone], If no answer or between  15:00h - 7:00h: call [phone]  08/27/2013 12:49 PM

## 2013-08-30 NOTE — Assessment & Plan Note (Signed)
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

## 2013-08-31 ENCOUNTER — Telehealth: Payer: Self-pay | Admitting: Internal Medicine

## 2013-08-31 ENCOUNTER — Other Ambulatory Visit: Payer: Self-pay | Admitting: Internal Medicine

## 2013-08-31 ENCOUNTER — Ambulatory Visit: Payer: Medicare Other | Admitting: Internal Medicine

## 2013-08-31 NOTE — Telephone Encounter (Signed)
Please advise regarding VO for DNR-hospice doc to sign? thanks

## 2013-08-31 NOTE — Telephone Encounter (Signed)
Spoke with Varney Biles  She needs VO for DNR- hospice doc can sign  Also she is asking if the pt can d/c potassium, synthroid, and crestor  Please advise thanks

## 2013-08-31 NOTE — Telephone Encounter (Signed)
Can dc potassium based on clinical scenario at their discretion Can dc crestor  But, if she can eat I prefer she have synthroid till she cannot eat. Not expensive drug anyways  Dr. Brand Males, M.D., [facility].C.P Pulmonary and Critical Care Medicine Staff Physician [place] [facility]monary and Critical Care Pager: [phone], If no answer or between  15:00h - 7:00h: call [phone]  08/31/2013 5:11 PM

## 2013-08-31 NOTE — Telephone Encounter (Signed)
Yes for DNAR -

## 2013-08-31 NOTE — Telephone Encounter (Signed)
Called keisha LMTCB x1 Was this Juluis Rainier or is she needing the okay from MR?

## 2013-09-01 NOTE — Telephone Encounter (Signed)
Spoke with [facility] with Hospice and advised of Dr Golden Pop recommendations

## 2013-09-01 NOTE — Telephone Encounter (Signed)
lmomtcb x1 for [employer]

## 2013-09-01 NOTE — Telephone Encounter (Signed)
[place]

## 2013-09-21 ENCOUNTER — Telehealth: Payer: Self-pay | Admitting: Internal Medicine

## 2013-09-21 NOTE — Telephone Encounter (Signed)
I called spoke with Jennifer Ball. Pt c/o nausea off and on and has no order for anything for this. Pt takes very little occas dose of morphine. Would like something called in to have on hand. Please advise MR thanks [facility] [address] Allergies  Allergen Reactions  . Hydrocodone Nausea Only

## 2013-09-21 NOTE — Telephone Encounter (Signed)
Try reglan 5mg  q12h prn; for nausea  Dr. Brand Males, M.D., [facility].C.P Pulmonary and Critical Care Medicine Staff Physician [place] [facility]monary and Critical Care Pager: [phone], If no answer or between  15:00h - 7:00h: call [phone]  09/21/2013 6:24 PM

## 2013-09-22 MED ORDER — METOCLOPRAMIDE HCL 5 MG PO TABS: 5.0000 mg | ORAL_TABLET | Freq: Two times a day (BID) | ORAL | Status: AC | PRN

## 2013-09-22 NOTE — Telephone Encounter (Signed)
Called spoke with Teres. Aware of recs. Nothing further needed. RX called in

## 2013-10-10 DEATH — deceased

## 2013-10-15 ENCOUNTER — Ambulatory Visit: Payer: Medicare Other | Admitting: Internal Medicine
